# Patient Record
Sex: Male | Born: 1990
Health system: Southern US, Community
[De-identification: ages and names within clinical notes are randomized; demographics above are authoritative.]

## PROBLEM LIST (undated history)

## (undated) ENCOUNTER — Emergency Department (HOSPITAL_BASED_OUTPATIENT_CLINIC_OR_DEPARTMENT_OTHER): Payer: BC Managed Care – PPO

## (undated) DIAGNOSIS — I10 Essential (primary) hypertension: Secondary | ICD-10-CM

## (undated) DIAGNOSIS — E119 Type 2 diabetes mellitus without complications: Secondary | ICD-10-CM

---

## 2018-11-27 ENCOUNTER — Encounter (HOSPITAL_BASED_OUTPATIENT_CLINIC_OR_DEPARTMENT_OTHER): Payer: Self-pay

## 2018-11-27 ENCOUNTER — Emergency Department (HOSPITAL_BASED_OUTPATIENT_CLINIC_OR_DEPARTMENT_OTHER)
Admission: EM | Admit: 2018-11-27 | Discharge: 2018-11-27 | Disposition: A | Discharge status: Home / Self Care | Payer: Self-pay | Attending: Emergency Medicine | Admitting: Emergency Medicine

## 2018-11-27 ENCOUNTER — Other Ambulatory Visit: Payer: Self-pay

## 2018-11-27 DIAGNOSIS — Z794 Long term (current) use of insulin: Secondary | ICD-10-CM | POA: Insufficient documentation

## 2018-11-27 DIAGNOSIS — E1165 Type 2 diabetes mellitus with hyperglycemia: Secondary | ICD-10-CM | POA: Insufficient documentation

## 2018-11-27 DIAGNOSIS — H579 Unspecified disorder of eye and adnexa: Secondary | ICD-10-CM

## 2018-11-27 DIAGNOSIS — R739 Hyperglycemia, unspecified: Secondary | ICD-10-CM

## 2018-11-27 DIAGNOSIS — H5789 Other specified disorders of eye and adnexa: Secondary | ICD-10-CM | POA: Insufficient documentation

## 2018-11-27 HISTORY — DX: Type 2 diabetes mellitus without complications: E11.9

## 2018-11-27 LAB — CBC
HCT: 41.2 % (ref 39.0–52.0)
Hemoglobin: 15 g/dL (ref 13.0–17.0)
MCH: 30.2 pg (ref 26.0–34.0)
MCHC: 36.4 g/dL — ABNORMAL HIGH (ref 30.0–36.0)
MCV: 83.1 fL (ref 80.0–100.0)
Platelets: 238 10*3/uL (ref 150–400)
RBC: 4.96 MIL/uL (ref 4.22–5.81)
RDW: 12.9 % (ref 11.5–15.5)
WBC: 8.4 10*3/uL (ref 4.0–10.5)
nRBC: 0 % (ref 0.0–0.2)

## 2018-11-27 LAB — URINALYSIS, MICROSCOPIC (REFLEX)
Bacteria, UA: NONE SEEN
RBC / HPF: NONE SEEN RBC/hpf (ref 0–5)
Squamous Epithelial / HPF: NONE SEEN (ref 0–5)
WBC, UA: NONE SEEN WBC/hpf (ref 0–5)

## 2018-11-27 LAB — URINALYSIS, ROUTINE W REFLEX MICROSCOPIC
Bilirubin Urine: NEGATIVE
Glucose, UA: >=500 mg/dL — AB
Hgb urine dipstick: NEGATIVE
Ketones, ur: NEGATIVE mg/dL
Leukocytes,Ua: NEGATIVE
Nitrite: NEGATIVE
Protein, ur: NEGATIVE mg/dL
Specific Gravity, Urine: 1.01 (ref 1.005–1.030)
pH: 6.5 (ref 5.0–8.0)

## 2018-11-27 LAB — BASIC METABOLIC PANEL
Anion gap: 15 (ref 5–15)
BUN: 10 mg/dL (ref 6–20)
CO2: 21 mmol/L — ABNORMAL LOW (ref 22–32)
Calcium: 9.1 mg/dL (ref 8.9–10.3)
Chloride: 95 mmol/L — ABNORMAL LOW (ref 98–111)
Creatinine, Ser: 0.63 mg/dL (ref 0.61–1.24)
GFR calc Af Amer: >60 mL/min (ref >60)
GFR calc non Af Amer: >60 mL/min (ref >60)
Glucose, Bld: 472 mg/dL — ABNORMAL HIGH (ref 70–99)
Potassium: 3.8 mmol/L (ref 3.5–5.1)
Sodium: 131 mmol/L — ABNORMAL LOW (ref 135–145)

## 2018-11-27 LAB — CBG MONITORING, ED
Glucose-Capillary: 301 mg/dL — ABNORMAL HIGH (ref 70–99)
Glucose-Capillary: 431 mg/dL — ABNORMAL HIGH (ref 70–99)
Glucose-Capillary: 434 mg/dL — ABNORMAL HIGH (ref 70–99)

## 2018-11-27 MED ORDER — TETRACAINE HCL 0.5 % OP SOLN
2.0000 [drp] | Freq: Once | OPHTHALMIC | Status: AC
  Administered 2018-11-27: 2 [drp] via OPHTHALMIC
  Filled 2018-11-27: qty 4

## 2018-11-27 MED ORDER — INSULIN REGULAR HUMAN 100 UNIT/ML IJ SOLN
8.0000 [IU] | Freq: Once | INTRAMUSCULAR | Status: AC
  Administered 2018-11-27: 8 [IU] via SUBCUTANEOUS
  Filled 2018-11-27: qty 1

## 2018-11-27 MED ORDER — SODIUM CHLORIDE 0.9 % IV BOLUS
1000.0000 mL | Freq: Once | INTRAVENOUS | Status: AC
  Administered 2018-11-27: 01:00:00 1000 mL via INTRAVENOUS

## 2018-11-27 MED ORDER — INSULIN ASPART 100 UNIT/ML IV SOLN
8.0000 [IU] | Freq: Once | INTRAVENOUS | Status: AC
  Administered 2018-11-27: 8 [IU] via INTRAVENOUS
  Filled 2018-11-27: qty 1

## 2018-11-27 MED ORDER — FLUORESCEIN SODIUM 1 MG OP STRP
1.0000 | ORAL_STRIP | Freq: Once | OPHTHALMIC | Status: AC
  Administered 2018-11-27: 01:00:00 1 via OPHTHALMIC
  Filled 2018-11-27: qty 1

## 2018-11-27 NOTE — ED Provider Notes (Signed)
[facility] EMERGENCY DEPARTMENT Provider Note   CSN: [id number] Arrival date & time: 11/27/18  0008     History   Chief Complaint Chief Complaint  Patient presents with  . Blood Sugar Problem    HPI Stanley James is a 28 y.o. male.     Patient is a 28 year old male with past medical history of diabetes.  He presents today for evaluation of elevated blood sugar.  He states that his sugars been running high for the past several days and he is having difficulty getting it to come down.  He denies any changes in his diet and denies any recent illness.  He denies any fevers or chills.  He does report generalized weakness.  He reports working on his car today and feels as though something may have scratched his eye.  He describes burning and irritation in the right eye along with blurry vision.  The history is provided by the patient.    Past Medical History:  Diagnosis Date  . Diabetes mellitus without complication ([place])     There are no active problems to display for this patient.   History reviewed. No pertinent surgical history.      Home Medications    Prior to Admission medications   Medication Sig Start Date End Date Taking? Authorizing Provider  Insulin Degludec (TRESIBA) 100 UNIT/ML SOLN Inject 50 Units into the skin.   Yes [provider]  insulin lispro (HUMALOG) 100 UNIT/ML injection Inject 12 Units into the skin 3 (three) times daily before meals.   Yes [provider]    Family History No family history on file.  Social History Social History   Tobacco Use  . Smoking status: Never Smoker  . Smokeless tobacco: Never Used  Substance Use Topics  . Alcohol use: Never    Frequency: Never  . Drug use: Never     Allergies   Patient has no known allergies.   Review of Systems Review of Systems  All other systems reviewed and are negative.    Physical Exam Updated Vital Signs BP (!) 148/82 (BP Location: Right  Arm)   Pulse 79   Temp 98.3 F (36.8 C) (Oral)   Resp 20   Ht 5\' 8"  (1.727 m)   Wt 88.9 kg   SpO2 99%   BMI 29.80 kg/m   Physical Exam Vitals signs and nursing note reviewed.  Constitutional:      General: He is not in acute distress.    Appearance: He is well-developed. He is not diaphoretic.  HENT:     Head: Normocephalic and atraumatic.  Eyes:     Extraocular Movements: Extraocular movements intact.     Pupils: Pupils are equal, round, and reactive to light.     Comments: Patient's right eye has a clear cornea with no obvious foreign body.  The anterior chamber is clear and pupil is reactive.  Conjunctiva appears normal with no foreign body noted under the lids.  Fluorescein staining reveals no uptake.  Neck:     Musculoskeletal: Normal range of motion and neck supple.  Cardiovascular:     Rate and Rhythm: Normal rate and regular rhythm.     Heart sounds: No murmur. No friction rub.  Pulmonary:     Effort: Pulmonary effort is normal. No respiratory distress.     Breath sounds: Normal breath sounds. No wheezing or rales.  Abdominal:     General: Bowel sounds are normal. There is no distension.  Palpations: Abdomen is soft.     Tenderness: There is no abdominal tenderness.  Musculoskeletal: Normal range of motion.  Skin:    General: Skin is warm and dry.  Neurological:     Mental Status: He is alert and oriented to person, place, and time.     Coordination: Coordination normal.      ED Treatments / Results  Labs (all labs ordered are listed, but only abnormal results are displayed) Labs Reviewed  CBG MONITORING, ED - Abnormal; Notable for the following components:      Result Value   Glucose-Capillary 434 (*)    All other components within normal limits  BASIC METABOLIC PANEL  CBC  URINALYSIS, ROUTINE W REFLEX MICROSCOPIC    EKG None  Radiology No results found.  Procedures Procedures (including critical care time)  Medications Ordered in ED  Medications  insulin regular (NOVOLIN R) 100 units/mL injection 8 Units (has no administration in time range)  sodium chloride 0.9 % bolus 1,000 mL (has no administration in time range)  fluorescein ophthalmic strip 1 strip (has no administration in time range)  tetracaine (PONTOCAINE) 0.5 % ophthalmic solution 2 drop (has no administration in time range)     Initial Impression / Assessment and Plan / ED Course  I have reviewed the triage vital signs and the nursing notes.  Pertinent labs & imaging results that were available during my care of the patient were reviewed by me and considered in my medical decision making (see chart for details).  Patient presenting here with elevated blood sugar.  He is hyperglycemic, however there is no evidence for DKA.  Patient given IV fluids and doses of insulin and his sugar is now improving.  I see no indication for admission.  Patient will be discharged with instructions to keep a record of his blood sugars and follow-up with his primary doctor for possible adjustments in his insulin regimen.  He also has a foreign body sensation to his eye, the etiology of which I cannot ascertain.  I see no evidence for corneal abrasion or corneal foreign body.  There is also no evidence for foreign body under the eyelids when his eyes were everted.    Final Clinical Impressions(s) / ED Diagnoses   Final diagnoses:  None    ED Discharge Orders    None       Geoffery Lyonselo, Lucita Montoya, MD 11/27/18 [phone]

## 2018-11-27 NOTE — ED Triage Notes (Signed)
Pt c/o hyperglycemia today and reports a BGL 420. Pt also c/o blurred vision in his R eye.

## 2018-11-27 NOTE — Discharge Instructions (Signed)
Continue medications as previously prescribed.  Keep a record of your blood sugars and take this with you to your next doctor's appointment to discuss whether or not you need adjustments made in your insulin regimen.  Ideally this appointment should take place in the next week.  Return to the emergency department if your symptoms significantly worsen or change.

## 2018-11-28 MED FILL — Insulin Regular (Human) Inj 100 Unit/ML: INTRAMUSCULAR | Qty: 0.08 | Status: AC

## 2019-01-07 ENCOUNTER — Telehealth (HOSPITAL_BASED_OUTPATIENT_CLINIC_OR_DEPARTMENT_OTHER): Payer: Self-pay | Admitting: *Deleted

## 2019-01-07 ENCOUNTER — Emergency Department (HOSPITAL_BASED_OUTPATIENT_CLINIC_OR_DEPARTMENT_OTHER)
Admission: EM | Admit: 2019-01-07 | Discharge: 2019-01-07 | Disposition: A | Discharge status: Home / Self Care | Payer: Self-pay | Attending: Emergency Medicine | Admitting: Emergency Medicine

## 2019-01-07 ENCOUNTER — Encounter (HOSPITAL_BASED_OUTPATIENT_CLINIC_OR_DEPARTMENT_OTHER): Payer: Self-pay | Admitting: Emergency Medicine

## 2019-01-07 ENCOUNTER — Other Ambulatory Visit: Payer: Self-pay

## 2019-01-07 DIAGNOSIS — Z794 Long term (current) use of insulin: Secondary | ICD-10-CM | POA: Insufficient documentation

## 2019-01-07 DIAGNOSIS — R110 Nausea: Secondary | ICD-10-CM | POA: Insufficient documentation

## 2019-01-07 DIAGNOSIS — E1165 Type 2 diabetes mellitus with hyperglycemia: Secondary | ICD-10-CM | POA: Insufficient documentation

## 2019-01-07 DIAGNOSIS — R739 Hyperglycemia, unspecified: Secondary | ICD-10-CM

## 2019-01-07 LAB — CBC WITH DIFFERENTIAL/PLATELET
Abs Immature Granulocytes: 0.02 10*3/uL (ref 0.00–0.07)
Basophils Absolute: 0.1 10*3/uL (ref 0.0–0.1)
Basophils Relative: 1 %
Eosinophils Absolute: 0.1 10*3/uL (ref 0.0–0.5)
Eosinophils Relative: 2 %
HCT: 45.5 % (ref 39.0–52.0)
Hemoglobin: 15.7 g/dL (ref 13.0–17.0)
Immature Granulocytes: 0 %
Lymphocytes Relative: 38 %
Lymphs Abs: 2.5 10*3/uL (ref 0.7–4.0)
MCH: 29.5 pg (ref 26.0–34.0)
MCHC: 34.5 g/dL (ref 30.0–36.0)
MCV: 85.4 fL (ref 80.0–100.0)
Monocytes Absolute: 0.4 10*3/uL (ref 0.1–1.0)
Monocytes Relative: 7 %
Neutro Abs: 3.4 10*3/uL (ref 1.7–7.7)
Neutrophils Relative %: 52 %
Platelets: 174 10*3/uL (ref 150–400)
RBC: 5.33 MIL/uL (ref 4.22–5.81)
RDW: 12.6 % (ref 11.5–15.5)
WBC: 6.5 10*3/uL (ref 4.0–10.5)
nRBC: 0 % (ref 0.0–0.2)

## 2019-01-07 LAB — POCT I-STAT EG7
Acid-Base Excess: 1 mmol/L (ref 0.0–2.0)
Bicarbonate: 26.4 mmol/L (ref 20.0–28.0)
Calcium, Ion: 1.26 mmol/L (ref 1.15–1.40)
HCT: 46 % (ref 39.0–52.0)
Hemoglobin: 15.6 g/dL (ref 13.0–17.0)
O2 Saturation: 67 %
Patient temperature: 99
Potassium: 4.5 mmol/L (ref 3.5–5.1)
Sodium: 134 mmol/L — ABNORMAL LOW (ref 135–145)
TCO2: 28 mmol/L (ref 22–32)
pCO2, Ven: 44.5 mmHg (ref 44.0–60.0)
pH, Ven: 7.383 (ref 7.250–7.430)
pO2, Ven: 36 mmHg (ref 32.0–45.0)

## 2019-01-07 LAB — URINALYSIS, ROUTINE W REFLEX MICROSCOPIC
Bilirubin Urine: NEGATIVE
Glucose, UA: >=500 mg/dL — AB
Hgb urine dipstick: NEGATIVE
Ketones, ur: 15 mg/dL — AB
Leukocytes,Ua: NEGATIVE
Nitrite: NEGATIVE
Protein, ur: NEGATIVE mg/dL
Specific Gravity, Urine: 1.01 (ref 1.005–1.030)
pH: 7 (ref 5.0–8.0)

## 2019-01-07 LAB — CBG MONITORING, ED
Glucose-Capillary: 424 mg/dL — ABNORMAL HIGH (ref 70–99)
Glucose-Capillary: 299 mg/dL — ABNORMAL HIGH (ref 70–99)
Glucose-Capillary: 235 mg/dL — ABNORMAL HIGH (ref 70–99)

## 2019-01-07 LAB — URINALYSIS, MICROSCOPIC (REFLEX)
RBC / HPF: NONE SEEN RBC/hpf (ref 0–5)
Squamous Epithelial / HPF: NONE SEEN (ref 0–5)
WBC, UA: NONE SEEN WBC/hpf (ref 0–5)

## 2019-01-07 LAB — BASIC METABOLIC PANEL
Anion gap: 11 (ref 5–15)
BUN: 14 mg/dL (ref 6–20)
CO2: 24 mmol/L (ref 22–32)
Calcium: 9.8 mg/dL (ref 8.9–10.3)
Chloride: 97 mmol/L — ABNORMAL LOW (ref 98–111)
Creatinine, Ser: 0.69 mg/dL (ref 0.61–1.24)
GFR calc Af Amer: >60 mL/min (ref >60)
GFR calc non Af Amer: >60 mL/min (ref >60)
Glucose, Bld: 382 mg/dL — ABNORMAL HIGH (ref 70–99)
Potassium: 4.3 mmol/L (ref 3.5–5.1)
Sodium: 132 mmol/L — ABNORMAL LOW (ref 135–145)

## 2019-01-07 MED ORDER — INSULIN ASPART 100 UNIT/ML IV SOLN
10.0000 [IU] | Freq: Once | INTRAVENOUS | Status: AC
  Administered 2019-01-07: 10 [IU] via INTRAVENOUS
  Filled 2019-01-07: qty 1

## 2019-01-07 MED ORDER — ONDANSETRON HCL 4 MG/2ML IJ SOLN
4.0000 mg | Freq: Once | INTRAMUSCULAR | Status: DC
  Filled 2019-01-07: qty 2

## 2019-01-07 MED ORDER — SODIUM CHLORIDE 0.9 % IV BOLUS
1000.0000 mL | Freq: Once | INTRAVENOUS | Status: AC
  Administered 2019-01-07: 12:00:00 1000 mL via INTRAVENOUS

## 2019-01-07 MED ORDER — SODIUM CHLORIDE 0.9 % IV SOLN
INTRAVENOUS | Status: DC
  Administered 2019-01-07: 13:00:00 via INTRAVENOUS

## 2019-01-07 MED ORDER — ONDANSETRON 4 MG PO TBDP: 4.0000 mg | ORAL_TABLET | Freq: Three times a day (TID) | ORAL | 0 refills | Status: DC | PRN

## 2019-01-07 NOTE — Telephone Encounter (Signed)
Pt called stating he is out of his nausea medication.Spoke with Dr. Laverta Baltimore who reviewed chart and states will send in Rx to [facility] [address] (24 hr pharmacy) in [facility]. Pt verbalized understanding

## 2019-01-07 NOTE — ED Triage Notes (Signed)
Pt reports hyperglycemia x 3 days. States he is compliant with medication.

## 2019-01-07 NOTE — ED Provider Notes (Signed)
[facility] EMERGENCY DEPARTMENT Provider Note   CSN: [id number] Arrival date & time: 01/07/19  1028   History   Chief Complaint Chief Complaint  Patient presents with  . Hyperglycemia   HPI Stanley James is a 28 y.o. male with past medical history significant for diabetes who presents for evaluation of elevated blood sugar.  Patient states he had 2 episodes of nonbloody, nonbilious emesis.  Patient states his blood sugars have been in the 300s/400s over the last 2 days.  Prior to this morning he is tolerating p.o. intake without difficulty.  He has not tried any oral intake since he had his episodes of emesis.  Denies fever, chills, chest pain, shortness breath, abdominal pain, diarrhea, dysuria.  No recent sick contacts.  The last took his dose of home insulin yesterday around 5 PM.  Denies prior history of DKA or admission to hospital for hyperglycemia.  Denies additional aggravating or relieving factors.  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  No interpreter is used.     HPI  Past Medical History:  Diagnosis Date  . Diabetes mellitus without complication ([facility])     There are no active problems to display for this patient.   History reviewed. No pertinent surgical history.      Home Medications    Prior to Admission medications   Medication Sig Start Date End Date Taking? Authorizing Provider  Insulin Degludec (TRESIBA) 100 UNIT/ML SOLN Inject 50 Units into the skin.    [provider]  insulin lispro (HUMALOG) 100 UNIT/ML injection Inject 12 Units into the skin 3 (three) times daily before meals.    [provider]    Family History No family history on file.  Social History Social History   Tobacco Use  . Smoking status: Never Smoker  . Smokeless tobacco: Never Used  Substance Use Topics  . Alcohol use: Never    Frequency: Never  . Drug use: Never     Allergies   Patient has no known  allergies.   Review of Systems Review of Systems  Constitutional: Negative.   HENT: Negative.   Respiratory: Negative.   Cardiovascular: Negative.   Gastrointestinal: Positive for nausea and vomiting. Negative for abdominal distention, abdominal pain, anal bleeding, blood in stool, constipation, diarrhea and rectal pain.  Genitourinary: Negative.   Musculoskeletal: Negative.   Skin: Negative.   Neurological: Negative.   All other systems reviewed and are negative.    Physical Exam Updated Vital Signs BP 140/67 (BP Location: Right Arm)   Pulse 64   Temp 97.9 F (36.6 C) (Oral)   Resp 18   Ht 5\' 8"  (1.727 m)   Wt 80.7 kg   SpO2 100%   BMI 27.06 kg/m   Physical Exam Vitals signs and nursing note reviewed.  Constitutional:      General: He is not in acute distress.    Appearance: He is well-developed. He is not ill-appearing, toxic-appearing or diaphoretic.  HENT:     Head: Normocephalic and atraumatic.     Nose: Nose normal.     Mouth/Throat:     Mouth: Mucous membranes are moist.     Pharynx: Oropharynx is clear.  Eyes:     Pupils: Pupils are equal, round, and reactive to light.  Neck:     Musculoskeletal: Normal range of motion and neck supple.  Cardiovascular:     Rate and Rhythm: Normal rate and regular rhythm.     Pulses:  Normal pulses.     Heart sounds: Normal heart sounds.  Pulmonary:     Effort: Pulmonary effort is normal. No respiratory distress.     Breath sounds: Normal breath sounds.  Abdominal:     General: Bowel sounds are normal. There is no distension.     Palpations: Abdomen is soft.     Tenderness: There is no abdominal tenderness. There is no right CVA tenderness, left CVA tenderness, guarding or rebound.  Musculoskeletal: Normal range of motion.        General: No swelling, tenderness, deformity or signs of injury.     Right lower leg: No edema.     Left lower leg: No edema.  Skin:    General: Skin is warm and dry.     Capillary Refill:  Capillary refill takes less than 2 seconds.  Neurological:     General: No focal deficit present.     Mental Status: He is alert and oriented to person, place, and time.     Motor: No weakness.     Gait: Gait normal.     ED Treatments / Results  Labs (all labs ordered are listed, but only abnormal results are displayed) Labs Reviewed  URINALYSIS, ROUTINE W REFLEX MICROSCOPIC - Abnormal; Notable for the following components:      Result Value   Glucose, UA >=500 (*)    Ketones, ur 15 (*)    All other components within normal limits  URINALYSIS, MICROSCOPIC (REFLEX) - Abnormal; Notable for the following components:   Bacteria, UA FEW (*)    All other components within normal limits  BASIC METABOLIC PANEL - Abnormal; Notable for the following components:   Sodium 132 (*)    Chloride 97 (*)    Glucose, Bld 382 (*)    All other components within normal limits  CBG MONITORING, ED - Abnormal; Notable for the following components:   Glucose-Capillary 424 (*)    All other components within normal limits  CBG MONITORING, ED - Abnormal; Notable for the following components:   Glucose-Capillary 299 (*)    All other components within normal limits  POCT I-STAT EG7 - Abnormal; Notable for the following components:   Sodium 134 (*)    All other components within normal limits  CBG MONITORING, ED - Abnormal; Notable for the following components:   Glucose-Capillary 235 (*)    All other components within normal limits  CBC WITH DIFFERENTIAL/PLATELET  BLOOD GAS, VENOUS    EKG None  Radiology No results found.  Procedures Procedures (including critical care time)  Medications Ordered in ED Medications  sodium chloride 0.9 % bolus 1,000 mL (0 mLs Intravenous Stopped 01/07/19 1305)    And  0.9 %  sodium chloride infusion ( Intravenous New Bag/Given 01/07/19 1305)  ondansetron (ZOFRAN) injection 4 mg (4 mg Intravenous Refused 01/07/19 1156)  insulin aspart (novoLOG) injection 10  Units (10 Units Intravenous Given 01/07/19 1226)   Initial Impression / Assessment and Plan / ED Course  I have reviewed the triage vital signs and the nursing notes.  Pertinent labs & imaging results that were available during my care of the patient were reviewed by me and considered in my medical decision making (see chart for details).  28 year old male appears otherwise well presents for evaluation of elevated blood sugar and emesis.  He is afebrile, nonseptic, non-ill-appearing.  2 episodes of nonbloody, nonbilious emesis.  Heart and lungs clear.  Abdomen soft, nontender without rebound or guarding.  CBG 424.  Urinalysis with glucose with 15 ketones, VBG with stable pH and normal bicarb.  Will start on IV fluids, Zofran for nausea, 10 units insulin.  Labs personally reviewed and interpreted: CBC without leukocytosis, hemoglobin stable at 10.1 Metabolic panel with hyponatremia 132, glucose elevated at 382 no anion gap. No DKA.  Getting IV fluids, 10 units insulin.  We will need to recheck CBG.  If trending down may DC home with close outpatient follow-up.    Patient tolerating PO intake without difficulty. CBG trending down.  Patient is nontoxic, nonseptic appearing, in no apparent distress.  Patient's pain and other symptoms adequately managed in emergency department. Patient does not meet the SIRS or Sepsis criteria.  On repeat exam patient does not have a surgical abdomin and there are no peritoneal signs.  No indication of appendicitis, bowel obstruction, bowel perforation, cholecystitis, diverticulitis.    The patient has been appropriately medically screened and/or stabilized in the ED. I have low suspicion for any other emergent medical condition which would require further screening, evaluation or treatment in the ED or require inpatient management.      Final Clinical Impressions(s) / ED Diagnoses   Final diagnoses:  Hyperglycemia  Nausea    ED Discharge Orders    None        Viraaj Vorndran A, PA-C 01/07/19 1433    Little, Wenda Overland, MD 01/08/19 [phone]

## 2019-01-07 NOTE — ED Notes (Signed)
Pt tolerating water. Given diet gingerale per pt request

## 2019-01-07 NOTE — Discharge Instructions (Signed)
Take your medication as prescribed. Follow up with your PCP for reevaluation of your insulin dosing,  Return for new or worsening symptoms.

## 2019-01-07 NOTE — Telephone Encounter (Signed)
Patient was seen in the emergency department earlier today.  Initially thought they had Zofran at home but then called back to say they did not.  I will call in a small prescription of Zofran to the pharmacy of choice.

## 2019-01-07 NOTE — ED Notes (Signed)
Pt given work note. Ambulated to d/c window with steady gait

## 2019-04-25 ENCOUNTER — Emergency Department (HOSPITAL_BASED_OUTPATIENT_CLINIC_OR_DEPARTMENT_OTHER): Payer: Self-pay

## 2019-04-25 ENCOUNTER — Other Ambulatory Visit: Payer: Self-pay

## 2019-04-25 ENCOUNTER — Emergency Department (HOSPITAL_BASED_OUTPATIENT_CLINIC_OR_DEPARTMENT_OTHER)
Admission: EM | Admit: 2019-04-25 | Discharge: 2019-04-25 | Disposition: A | Discharge status: Home / Self Care | Payer: Self-pay | Attending: Emergency Medicine | Admitting: Emergency Medicine

## 2019-04-25 ENCOUNTER — Encounter (HOSPITAL_BASED_OUTPATIENT_CLINIC_OR_DEPARTMENT_OTHER): Payer: Self-pay

## 2019-04-25 DIAGNOSIS — R739 Hyperglycemia, unspecified: Secondary | ICD-10-CM

## 2019-04-25 DIAGNOSIS — Z794 Long term (current) use of insulin: Secondary | ICD-10-CM | POA: Insufficient documentation

## 2019-04-25 DIAGNOSIS — R1084 Generalized abdominal pain: Secondary | ICD-10-CM | POA: Insufficient documentation

## 2019-04-25 DIAGNOSIS — I10 Essential (primary) hypertension: Secondary | ICD-10-CM | POA: Insufficient documentation

## 2019-04-25 HISTORY — DX: Essential (primary) hypertension: I10

## 2019-04-25 LAB — COMPREHENSIVE METABOLIC PANEL
ALT: 31 U/L (ref 0–44)
AST: 24 U/L (ref 15–41)
Albumin: 4.5 g/dL (ref 3.5–5.0)
Alkaline Phosphatase: 102 U/L (ref 38–126)
Anion gap: 11 (ref 5–15)
BUN: 11 mg/dL (ref 6–20)
CO2: 26 mmol/L (ref 22–32)
Calcium: 9.5 mg/dL (ref 8.9–10.3)
Chloride: 94 mmol/L — ABNORMAL LOW (ref 98–111)
Creatinine, Ser: 0.75 mg/dL (ref 0.61–1.24)
GFR calc Af Amer: >60 mL/min (ref >60)
GFR calc non Af Amer: >60 mL/min (ref >60)
Glucose, Bld: 515 mg/dL (ref 70–99)
Potassium: 5.1 mmol/L (ref 3.5–5.1)
Sodium: 131 mmol/L — ABNORMAL LOW (ref 135–145)
Total Bilirubin: 0.8 mg/dL (ref 0.3–1.2)
Total Protein: 8 g/dL (ref 6.5–8.1)

## 2019-04-25 LAB — URINALYSIS, ROUTINE W REFLEX MICROSCOPIC
Bilirubin Urine: NEGATIVE
Glucose, UA: >=500 mg/dL — AB
Hgb urine dipstick: NEGATIVE
Ketones, ur: NEGATIVE mg/dL
Leukocytes,Ua: NEGATIVE
Nitrite: NEGATIVE
Protein, ur: NEGATIVE mg/dL
Specific Gravity, Urine: 1.015 (ref 1.005–1.030)
pH: 5.5 (ref 5.0–8.0)

## 2019-04-25 LAB — CBC WITH DIFFERENTIAL/PLATELET
Abs Immature Granulocytes: 0.01 10*3/uL (ref 0.00–0.07)
Basophils Absolute: 0.1 10*3/uL (ref 0.0–0.1)
Basophils Relative: 1 %
Eosinophils Absolute: 0.1 10*3/uL (ref 0.0–0.5)
Eosinophils Relative: 1 %
HCT: 44.1 % (ref 39.0–52.0)
Hemoglobin: 15.3 g/dL (ref 13.0–17.0)
Immature Granulocytes: 0 %
Lymphocytes Relative: 46 %
Lymphs Abs: 2.8 10*3/uL (ref 0.7–4.0)
MCH: 28.6 pg (ref 26.0–34.0)
MCHC: 34.7 g/dL (ref 30.0–36.0)
MCV: 82.4 fL (ref 80.0–100.0)
Monocytes Absolute: 0.4 10*3/uL (ref 0.1–1.0)
Monocytes Relative: 7 %
Neutro Abs: 2.8 10*3/uL (ref 1.7–7.7)
Neutrophils Relative %: 45 %
Platelets: 192 10*3/uL (ref 150–400)
RBC: 5.35 MIL/uL (ref 4.22–5.81)
RDW: 13.2 % (ref 11.5–15.5)
WBC: 6.1 10*3/uL (ref 4.0–10.5)
nRBC: 0 % (ref 0.0–0.2)

## 2019-04-25 LAB — POCT I-STAT EG7
Bicarbonate: 26.1 mmol/L (ref 20.0–28.0)
Calcium, Ion: 1.2 mmol/L (ref 1.15–1.40)
HCT: 46 % (ref 39.0–52.0)
Hemoglobin: 15.6 g/dL (ref 13.0–17.0)
O2 Saturation: 48 %
Potassium: 5 mmol/L (ref 3.5–5.1)
Sodium: 131 mmol/L — ABNORMAL LOW (ref 135–145)
TCO2: 27 mmol/L (ref 22–32)
pCO2, Ven: 44.8 mmHg (ref 44.0–60.0)
pH, Ven: 7.374 (ref 7.250–7.430)
pO2, Ven: 27 mmHg — CL (ref 32.0–45.0)

## 2019-04-25 LAB — CBG MONITORING, ED
Glucose-Capillary: 545 mg/dL (ref 70–99)
Glucose-Capillary: 243 mg/dL — ABNORMAL HIGH (ref 70–99)

## 2019-04-25 LAB — URINALYSIS, MICROSCOPIC (REFLEX): WBC, UA: NONE SEEN WBC/hpf (ref 0–5)

## 2019-04-25 MED ORDER — DICYCLOMINE HCL 20 MG PO TABS: 20.0000 mg | ORAL_TABLET | Freq: Three times a day (TID) | ORAL | 0 refills | Status: DC | PRN

## 2019-04-25 MED ORDER — ONDANSETRON 4 MG PO TBDP: 4.0000 mg | ORAL_TABLET | Freq: Three times a day (TID) | ORAL | 0 refills | Status: DC | PRN

## 2019-04-25 MED ORDER — INSULIN REGULAR HUMAN 100 UNIT/ML IJ SOLN
10.0000 [IU] | Freq: Once | INTRAMUSCULAR | Status: AC
  Administered 2019-04-25: 10 [IU] via INTRAVENOUS
  Filled 2019-04-25: qty 1

## 2019-04-25 MED ORDER — SODIUM CHLORIDE 0.9 % IV BOLUS
1000.0000 mL | Freq: Once | INTRAVENOUS | Status: AC
  Administered 2019-04-25: 17:00:00 1000 mL via INTRAVENOUS

## 2019-04-25 MED ORDER — IOHEXOL 300 MG/ML  SOLN
100.0000 mL | Freq: Once | INTRAMUSCULAR | Status: AC
  Administered 2019-04-25: 100 mL via INTRAVENOUS

## 2019-04-25 NOTE — ED Provider Notes (Signed)
Emergency Department Provider Note   I have reviewed the triage vital signs and the nursing notes.   HISTORY  Chief Complaint Hyperglycemia   HPI Stanley James is a 29 y.o. male with past medical history of hypertension and insulin-dependent diabetes presents to the emergency department with elevated blood sugars at home with left-sided abdominal pain.  His abdominal pain is cramping in nature and intermittent.  He states that severe at times with no clear modifying factors.  No radiation of symptoms.  No fevers or chills.  He has been compliant with his insulin.  He denies any vomiting or diarrhea.  He did have COVID-19 in December but has made a full recovery with minimal symptoms.   Past Medical History:  Diagnosis Date  . Diabetes mellitus without complication ([place])   . Hypertension     There are no problems to display for this patient.   History reviewed. No pertinent surgical history.  Allergies Patient has no known allergies.  History reviewed. No pertinent family history.  Social History Social History   Tobacco Use  . Smoking status: Former Research scientist (life sciences)  . Smokeless tobacco: Never Used  Substance Use Topics  . Alcohol use: Never  . Drug use: Never    Review of Systems  Constitutional: No fever/chills. High blood sugar.  Eyes: No visual changes. ENT: No sore throat. Cardiovascular: Denies chest pain. Respiratory: Denies shortness of breath. Gastrointestinal: Positive cramping left sided abdominal pain.  No nausea, no vomiting.  No diarrhea.  No constipation. Genitourinary: Negative for dysuria. Musculoskeletal: Negative for back pain. Skin: Negative for rash. Neurological: Negative for headaches, focal weakness or numbness.  10-point ROS otherwise negative.  ____________________________________________   PHYSICAL EXAM:  VITAL SIGNS: ED Triage Vitals  Enc Vitals Group     BP 04/25/19 1649 (!) 151/79     Pulse Rate 04/25/19 1649 90     Resp  04/25/19 1649 17     Temp 04/25/19 1649 98.7 F (37.1 C)     Temp Source 04/25/19 1649 Oral     SpO2 04/25/19 1649 99 %     Weight 04/25/19 1650 174 lb (78.9 kg)     Height 04/25/19 1650 5\' 8"  (1.727 m)   Constitutional: Alert and oriented. Well appearing and in no acute distress. Eyes: Conjunctivae are normal.  Head: Atraumatic. Nose: No congestion/rhinnorhea. Mouth/Throat: Mucous membranes are moist.  Neck: No stridor.  Cardiovascular: Normal rate, regular rhythm. Good peripheral circulation. Grossly normal heart sounds.   Respiratory: Normal respiratory effort.  No retractions. Lungs CTAB. Gastrointestinal: Soft with mild left-sided tenderness.  No rebound or guarding. No distention.  Musculoskeletal: No gross deformities of extremities. Neurologic:  Normal speech and language.  Skin:  Skin is warm, dry and intact. No rash noted.  ____________________________________________   LABS (all labs ordered are listed, but only abnormal results are displayed)  Labs Reviewed  COMPREHENSIVE METABOLIC PANEL - Abnormal; Notable for the following components:      Result Value   Sodium 131 (*)    Chloride 94 (*)    Glucose, Bld 515 (*)    All other components within normal limits  URINALYSIS, ROUTINE W REFLEX MICROSCOPIC - Abnormal; Notable for the following components:   Glucose, UA >=500 (*)    All other components within normal limits  URINALYSIS, MICROSCOPIC (REFLEX) - Abnormal; Notable for the following components:   Bacteria, UA RARE (*)    All other components within normal limits  CBG MONITORING, ED - Abnormal; Notable for  the following components:   Glucose-Capillary 545 (*)    All other components within normal limits  POCT I-STAT EG7 - Abnormal; Notable for the following components:   pO2, Ven 27.0 (*)    Sodium 131 (*)    All other components within normal limits  CBG MONITORING, ED - Abnormal; Notable for the following components:   Glucose-Capillary 243 (*)    All  other components within normal limits  CBC WITH DIFFERENTIAL/PLATELET  I-STAT VENOUS BLOOD GAS, ED  CBG MONITORING, ED   ____________________________________________  EKG  None  ____________________________________________  RADIOLOGY  CT ABDOMEN PELVIS W CONTRAST  Result Date: 04/25/2019 CLINICAL DATA:  29 year old male with left lower quadrant abdominal pain. Concern for acute diverticulitis. EXAM: CT ABDOMEN AND PELVIS WITH CONTRAST TECHNIQUE: Multidetector CT imaging of the abdomen and pelvis was performed using the standard protocol following bolus administration of intravenous contrast. CONTRAST:  OMNIPAQUE IOHEXOL 300 MG/ML  SOLN COMPARISON:  CT abdomen pelvis report dated 05/31/2011. The images are not available for direct comparison. FINDINGS: Lower chest: The visualized lung bases are clear. No intra-abdominal free air or free fluid. Hepatobiliary: Diffuse fatty infiltration of the liver. No intrahepatic biliary ductal dilatation. The gallbladder is unremarkable. Pancreas: Unremarkable. No pancreatic ductal dilatation or surrounding inflammatory changes. Spleen: Normal in size without focal abnormality. Adrenals/Urinary Tract: The adrenal glands are unremarkable. The kidneys, visualized ureters, and urinary bladder appear unremarkable as well. Stomach/Bowel: There is moderate amount of stool throughout the colon. There is no bowel dilatation or evidence of obstruction. The appendix is normal. Vascular/Lymphatic: The abdominal aorta and IVC are unremarkable. No portal venous gas. There is no adenopathy. Reproductive: The prostate and seminal vesicles are grossly unremarkable. No pelvic mass. Other: None Musculoskeletal: No acute or significant osseous findings. IMPRESSION: 1. No acute intra-abdominal or pelvic pathology. No bowel obstruction or active inflammation. Normal appendix. 2. Fatty liver. Electronically Signed   By: Elgie Collard M.D.   On: 04/25/2019 18:17     ____________________________________________   PROCEDURES  Procedure(s) performed:   Procedures  None  ____________________________________________   INITIAL IMPRESSION / ASSESSMENT AND PLAN / ED COURSE  Pertinent labs & imaging results that were available during my care of the patient were reviewed by me and considered in my medical decision making (see chart for details).   Patient presents to the emergency department for evaluation of hyperglycemia with left-sided abdominal pain.  VBG with pH of 7.37 and normal CBC.  CMP is pending but i-STAT CBG with blood sugar greater than 500.   Labs reviewed.  No evidence of DKA.  Blood sugar downtrending with IV fluids and insulin here.  Obtain CT abdomen pelvis with no other explanation for his abdominal pain with left-sided tenderness.  This was reviewed with no acute findings.  Discussed close PCP follow-up for additional discussion and consideration of his home insulin regimen.  No changes today.  Discussed ED return precautions. ____________________________________________  FINAL CLINICAL IMPRESSION(S) / ED DIAGNOSES  Final diagnoses:  Generalized abdominal pain  Hyperglycemia     MEDICATIONS GIVEN DURING THIS VISIT:  Medications  sodium chloride 0.9 % bolus 1,000 mL (0 mLs Intravenous Stopped 04/25/19 1830)  insulin regular (NOVOLIN R) 100 units/mL injection 10 Units (10 Units Intravenous Given 04/25/19 1748)  iohexol (OMNIPAQUE) 300 MG/ML solution 100 mL (100 mLs Intravenous Contrast Given 04/25/19 1753)     NEW OUTPATIENT MEDICATIONS STARTED DURING THIS VISIT:  Discharge Medication List as of 04/25/2019  6:38 PM    START  taking these medications   Details  dicyclomine (BENTYL) 20 MG tablet Take 1 tablet (20 mg total) by mouth 3 (three) times daily as needed for spasms (abdominal cramping)., Starting Tue 04/25/2019, Print        Note:  This document was prepared using Dragon voice recognition software and may include  unintentional dictation errors.  Alona Bene, MD, [facility] Emergency Medicine    Jazzman Loughmiller, Arlyss Repress, MD 04/25/19 2204

## 2019-04-25 NOTE — Discharge Instructions (Signed)

## 2019-04-25 NOTE — ED Triage Notes (Signed)
Since Sunday finger stick CBG @ home has been evaluated, this am >400 per pt. 15u humalog at 9am 12pm 15 u more, 1330 13u of tresciba. Now c/o left lower abd pain.

## 2019-04-26 MED FILL — Insulin Regular (Human) Inj 100 Unit/ML: INTRAMUSCULAR | Qty: 0.1 | Status: AC

## 2019-06-22 ENCOUNTER — Other Ambulatory Visit: Payer: Self-pay | Admitting: Physician Assistant

## 2019-06-22 ENCOUNTER — Ambulatory Visit (INDEPENDENT_AMBULATORY_CARE_PROVIDER_SITE_OTHER): Payer: Self-pay

## 2019-06-22 ENCOUNTER — Other Ambulatory Visit: Payer: Self-pay

## 2019-06-22 DIAGNOSIS — Z021 Encounter for pre-employment examination: Secondary | ICD-10-CM

## 2019-07-03 ENCOUNTER — Other Ambulatory Visit: Payer: Self-pay

## 2019-07-03 ENCOUNTER — Emergency Department (HOSPITAL_BASED_OUTPATIENT_CLINIC_OR_DEPARTMENT_OTHER)
Admission: EM | Admit: 2019-07-03 | Discharge: 2019-07-03 | Disposition: A | Discharge status: Home / Self Care | Payer: Self-pay | Attending: Emergency Medicine | Admitting: Emergency Medicine

## 2019-07-03 ENCOUNTER — Encounter (HOSPITAL_BASED_OUTPATIENT_CLINIC_OR_DEPARTMENT_OTHER): Payer: Self-pay | Admitting: Emergency Medicine

## 2019-07-03 DIAGNOSIS — I10 Essential (primary) hypertension: Secondary | ICD-10-CM | POA: Insufficient documentation

## 2019-07-03 DIAGNOSIS — E1065 Type 1 diabetes mellitus with hyperglycemia: Secondary | ICD-10-CM

## 2019-07-03 DIAGNOSIS — Z79899 Other long term (current) drug therapy: Secondary | ICD-10-CM | POA: Insufficient documentation

## 2019-07-03 LAB — URINALYSIS, ROUTINE W REFLEX MICROSCOPIC
Bilirubin Urine: NEGATIVE
Glucose, UA: >=500 mg/dL — AB
Hgb urine dipstick: NEGATIVE
Ketones, ur: NEGATIVE mg/dL
Leukocytes,Ua: NEGATIVE
Nitrite: NEGATIVE
Protein, ur: NEGATIVE mg/dL
Specific Gravity, Urine: <1.005 — ABNORMAL LOW (ref 1.005–1.030)
pH: 5.5 (ref 5.0–8.0)

## 2019-07-03 LAB — COMPREHENSIVE METABOLIC PANEL
ALT: 32 U/L (ref 0–44)
AST: 23 U/L (ref 15–41)
Albumin: 4.6 g/dL (ref 3.5–5.0)
Alkaline Phosphatase: 121 U/L (ref 38–126)
Anion gap: 13 (ref 5–15)
BUN: 13 mg/dL (ref 6–20)
CO2: 22 mmol/L (ref 22–32)
Calcium: 9.4 mg/dL (ref 8.9–10.3)
Chloride: 93 mmol/L — ABNORMAL LOW (ref 98–111)
Creatinine, Ser: 0.87 mg/dL (ref 0.61–1.24)
GFR calc Af Amer: >60 mL/min (ref >60)
GFR calc non Af Amer: >60 mL/min (ref >60)
Glucose, Bld: 619 mg/dL (ref 70–99)
Potassium: 4 mmol/L (ref 3.5–5.1)
Sodium: 128 mmol/L — ABNORMAL LOW (ref 135–145)
Total Bilirubin: 0.5 mg/dL (ref 0.3–1.2)
Total Protein: 8 g/dL (ref 6.5–8.1)

## 2019-07-03 LAB — CBC
HCT: 42.6 % (ref 39.0–52.0)
Hemoglobin: 15.2 g/dL (ref 13.0–17.0)
MCH: 29.6 pg (ref 26.0–34.0)
MCHC: 35.7 g/dL (ref 30.0–36.0)
MCV: 83 fL (ref 80.0–100.0)
Platelets: 170 10*3/uL (ref 150–400)
RBC: 5.13 MIL/uL (ref 4.22–5.81)
RDW: 12.7 % (ref 11.5–15.5)
WBC: 5.5 10*3/uL (ref 4.0–10.5)
nRBC: 0 % (ref 0.0–0.2)

## 2019-07-03 LAB — CBG MONITORING, ED
Glucose-Capillary: >600 mg/dL (ref 70–99)
Glucose-Capillary: 389 mg/dL — ABNORMAL HIGH (ref 70–99)

## 2019-07-03 LAB — URINALYSIS, MICROSCOPIC (REFLEX)

## 2019-07-03 MED ORDER — INSULIN LISPRO 100 UNIT/ML ~~LOC~~ SOLN: 12.0000 [IU] | Freq: Three times a day (TID) | SUBCUTANEOUS | 0 refills | Status: DC

## 2019-07-03 MED ORDER — INSULIN ASPART 100 UNIT/ML ~~LOC~~ SOLN
SUBCUTANEOUS | Status: AC
  Filled 2019-07-03: qty 1

## 2019-07-03 MED ORDER — INSULIN ASPART 100 UNIT/ML IV SOLN
10.0000 [IU] | Freq: Once | INTRAVENOUS | Status: AC
  Administered 2019-07-03: 10 [IU] via INTRAVENOUS
  Filled 2019-07-03: qty 0.1

## 2019-07-03 MED ORDER — LACTATED RINGERS IV BOLUS
2000.0000 mL | Freq: Once | INTRAVENOUS | Status: AC
  Administered 2019-07-03: 2000 mL via INTRAVENOUS

## 2019-07-03 MED ORDER — SODIUM CHLORIDE 0.9 % IV BOLUS
1000.0000 mL | Freq: Once | INTRAVENOUS | Status: AC
  Administered 2019-07-03: 1000 mL via INTRAVENOUS

## 2019-07-03 NOTE — ED Notes (Signed)
Pt states he started a keto diet ~1.5 weeks ago, had a 'cheat day' yesterday when sx began.

## 2019-07-03 NOTE — Discharge Instructions (Signed)
The social worker should contact you to try and set up an appointment with the family doctor.  Please call the hotline number provided if they have not called within a time that you were willing to wait.  Please return to the emergency department at anytime you wish to be reevaluated.  Unfortunately social work is not available at this facility, he may want to go to a emergency department that is attached to a hospital.

## 2019-07-03 NOTE — ED Notes (Signed)
Date and time results received: 07/03/19 2037 (use smartphrase ".now" to insert current time)  Test: glucose Critical Value: 619  Name of Provider Notified: Dr. Adela Lank  Orders Received? Or Actions Taken?: no new orders

## 2019-07-03 NOTE — Progress Notes (Addendum)
TOC CM spoke to pt and states he lost his insurance when he lost his job. He has been using his families insulin. He prefers to go to a clinic in [place]. Explained that [facility] does see pt without insurance on a sliding scale. Explained MATCH letter and once per year use with a $3 copay. Explained Evaristo Bury may not fit under the [facility] program. TOC CM will continue to follow for dc needs.Isidoro Donning RN CCM, [facility] ED TOC CM [phone]  07/04/2019 4:30 TOC CM provided pt with information for [facility]. He will have to go pick application and complete. Then the clinic will arrange an appt for pt to see a PCP. Provided pt with a MATCH, he will need a pay $3 copay and it will be a once per year use. Isidoro Donning RN CCM, [facility] TOC CM [phone]

## 2019-07-03 NOTE — ED Provider Notes (Signed)
[facility] EMERGENCY DEPARTMENT Provider Note   CSN: [id number] Arrival date & time: 07/03/19  1907     History Chief Complaint  Patient presents with  . Hyperglycemia    Stanley James is a 29 y.o. male29 yo M.  29 yo M with a chief complaints of hyperglycemia.  This is been going on for a couple days.  Patient states he went to sleep last night and noticed his blood sugar was in the 500s and when he woke up it was measured as high.  Patient has been slowly running out of insulin as he no longer has family physician to follow-up with and is worried about his follow-up.  He has had some diarrhea but that resolved.  Denies abdominal pain denies fevers denies chest pain cough shortness of breath.   The history is provided by the patient.  Hyperglycemia Associated symptoms: increased thirst and polyuria   Associated symptoms: no abdominal pain, no chest pain, no confusion, no fever, no shortness of breath and no vomiting   Illness Severity:  Moderate Onset quality:  Gradual Duration:  1 day Timing:  Constant Progression:  Unchanged Chronicity:  Recurrent Associated symptoms: no abdominal pain, no chest pain, no congestion, no diarrhea, no fever, no headaches, no myalgias, no rash, no shortness of breath and no vomiting        Past Medical History:  Diagnosis Date  . Diabetes mellitus without complication ([place])   . Hypertension     There are no problems to display for this patient.   History reviewed. No pertinent surgical history.     History reviewed. No pertinent family history.  Social History   Tobacco Use  . Smoking status: Former Research scientist (life sciences)  . Smokeless tobacco: Never Used  Substance Use Topics  . Alcohol use: Never  . Drug use: Never    Home Medications Prior to Admission medications   Medication Sig Start Date End Date Taking? Authorizing Provider  fenofibrate (TRICOR) 145 MG tablet Take by mouth. 08/03/17  Yes [provider]    lisinopril (ZESTRIL) 20 MG tablet Take by mouth. 08/03/17  Yes [provider]  QUEtiapine (SEROQUEL) 200 MG tablet Take by mouth. 08/16/17  Yes [provider]  dicyclomine (BENTYL) 20 MG tablet Take 1 tablet (20 mg total) by mouth 3 (three) times daily as needed for spasms (abdominal cramping). 04/25/19   Long, Wonda Olds, MD  Insulin Degludec (TRESIBA) 100 UNIT/ML SOLN Inject 50 Units into the skin.    [provider]  insulin lispro (HUMALOG) 100 UNIT/ML injection Inject 0.12 mLs (12 Units total) into the skin 3 (three) times daily before meals. 07/03/19   Deno Etienne, DO  ondansetron (ZOFRAN ODT) 4 MG disintegrating tablet Take 1 tablet (4 mg total) by mouth every 8 (eight) hours as needed. 04/25/19   Long, Wonda Olds, MD    Allergies    Patient has no known allergies.  Review of Systems   Review of Systems  Constitutional: Negative for chills and fever.  HENT: Negative for congestion and facial swelling.   Eyes: Negative for discharge and visual disturbance.  Respiratory: Negative for shortness of breath.   Cardiovascular: Negative for chest pain and palpitations.  Gastrointestinal: Negative for abdominal pain, diarrhea and vomiting.  Endocrine: Positive for polydipsia, polyphagia and polyuria.  Musculoskeletal: Negative for arthralgias and myalgias.  Skin: Negative for color change and rash.  Neurological: Negative for tremors, syncope and headaches.  Psychiatric/Behavioral: Negative for confusion and dysphoric mood.  Physical Exam Updated Vital Signs BP (!) 148/87 (BP Location: Right Arm)   Pulse 90   Temp 98.4 F (36.9 C) (Oral)   Resp 20   Ht 5\' 8"  (1.727 m)   Wt 82.8 kg   SpO2 98%   BMI 27.76 kg/m   Physical Exam Vitals and nursing note reviewed.  Constitutional:      Appearance: He is well-developed.  HENT:     Head: Normocephalic and atraumatic.  Eyes:     Pupils: Pupils are equal, round, and reactive to light.  Neck:     Vascular: No  JVD.  Cardiovascular:     Rate and Rhythm: Normal rate and regular rhythm.     Heart sounds: No murmur. No friction rub. No gallop.   Pulmonary:     Effort: No respiratory distress.     Breath sounds: No wheezing.  Abdominal:     General: There is no distension.     Tenderness: There is no abdominal tenderness. There is no guarding or rebound.  Musculoskeletal:        General: Normal range of motion.     Cervical back: Normal range of motion and neck supple.  Skin:    Coloration: Skin is not pale.     Findings: No rash.  Neurological:     Mental Status: He is alert and oriented to person, place, and time.  Psychiatric:        Behavior: Behavior normal.     ED Results / Procedures / Treatments   Labs (all labs ordered are listed, but only abnormal results are displayed) Labs Reviewed  URINALYSIS, ROUTINE W REFLEX MICROSCOPIC - Abnormal; Notable for the following components:      Result Value   Specific Gravity, Urine <1.005 (*)    Glucose, UA >=500 (*)    All other components within normal limits  COMPREHENSIVE METABOLIC PANEL - Abnormal; Notable for the following components:   Sodium 128 (*)    Chloride 93 (*)    Glucose, Bld 619 (*)    All other components within normal limits  URINALYSIS, MICROSCOPIC (REFLEX) - Abnormal; Notable for the following components:   Bacteria, UA FEW (*)    All other components within normal limits  CBG MONITORING, ED - Abnormal; Notable for the following components:   Glucose-Capillary >600 (*)    All other components within normal limits  CBG MONITORING, ED - Abnormal; Notable for the following components:   Glucose-Capillary 389 (*)    All other components within normal limits  CBC    EKG None  Radiology No results found.  Procedures Procedures (including critical care time)  Medications Ordered in ED Medications  sodium chloride 0.9 % bolus 1,000 mL (0 mLs Intravenous Stopped 07/03/19 2004)  lactated ringers bolus 2,000 mL (  Intravenous Stopped 07/03/19 2211)  insulin aspart (novoLOG) injection 10 Units (10 Units Intravenous Given 07/03/19 2238)    ED Course  I have reviewed the triage vital signs and the nursing notes.  Pertinent labs & imaging results that were available during my care of the patient were reviewed by me and considered in my medical decision making (see chart for details).    MDM Rules/Calculators/A&P                      29 yo M with a chief complaints of hyperglycemia.  Lab work with hyperglycemia without acidosis or ketones.  Will give 2 L of IV fluids given insulin bolus.  Patient was concerned that he has no follow-up as an outpatient.  Consult placed to social work.  Blood sugars improved significantly with IV fluids.  We will give a bolus of insulin here.  We will have him follow-up with family doctor.  Social work had contacted me that they should reach out to him for an appointment and help with his medications.  11:15 PM:  I have discussed the diagnosis/risks/treatment options with the patient and believe the pt to be eligible for discharge home to follow-up with PCP. We also discussed returning to the ED immediately if new or worsening sx occur. We discussed the sx which are most concerning (e.g., sudden worsening pain, fever, inability to tolerate by mouth) that necessitate immediate return. Medications administered to the patient during their visit and any new prescriptions provided to the patient are listed below.  Medications given during this visit Medications  sodium chloride 0.9 % bolus 1,000 mL (0 mLs Intravenous Stopped 07/03/19 2004)  lactated ringers bolus 2,000 mL ( Intravenous Stopped 07/03/19 2211)  insulin aspart (novoLOG) injection 10 Units (10 Units Intravenous Given 07/03/19 2238)     The patient appears reasonably screen and/or stabilized for discharge and I doubt any other medical condition or other [facility] requiring further screening, evaluation, or treatment in the ED  at this time prior to discharge.   Final Clinical Impression(s) / ED Diagnoses Final diagnoses:  Hyperglycemia due to type 1 diabetes mellitus (HCC)    Rx / DC Orders ED Discharge Orders         Ordered    insulin lispro (HUMALOG) 100 UNIT/ML injection  3 times daily before meals     07/03/19 2223           Melene Plan, DO 07/03/19 2315

## 2019-07-03 NOTE — ED Notes (Signed)
ED Provider at bedside. 

## 2019-07-03 NOTE — ED Triage Notes (Signed)
Patient presents with complaints of hyperglycemia this am; states meter read HI this am; states gave dose of sliding scale insulin; next reading was 584 after insulin dose; states meter then continued to read HI today regardless of giving multiple insulin doses. States diarrhea onset yesterday. Complains of dizziness today as well. Denies vomiting.

## 2019-07-04 NOTE — Progress Notes (Addendum)
  MATCH Medication Assistance Card  Name: Stanley James  ID (MRN): [id number] Bin: [id number] RX Group: [id number]  Discharge Date:07/03/2019 10:43 PM  Expiration Date:07/11/2019  (must be filled within 7 days of discharge)     Dear Stanley James:  You have been approved to have the prescriptions written by your discharging physician filled through our [facility] (Medication Assistance Through [facility]) program. This program allows for a one-time (no refills) 34-day supply of selected medications for a low copay amount.  The copay is $3.00 per prescription. For instance, if you have one prescription, you will pay $3.00; for two prescriptions, you pay $6.00; for three prescriptions, you pay $9.00; and so on.  Only certain pharmacies are participating in this program with [facility]. You will need to select one of the pharmacies from the attached list and take your prescriptions, this letter, and your photo ID to one of the participating pharmacies.   We are excited that you are able to use the [facility] program to get your medications. These prescriptions must be filled within 7 days of hospital discharge or they will no longer be valid for the [facility] program. Should you have any problems with your prescriptions please contact your case management team member at [phone]  for [place].   Thank you,    [facility]

## 2020-03-26 ENCOUNTER — Other Ambulatory Visit: Payer: Self-pay

## 2020-03-26 ENCOUNTER — Emergency Department (HOSPITAL_BASED_OUTPATIENT_CLINIC_OR_DEPARTMENT_OTHER)
Admission: EM | Admit: 2020-03-26 | Discharge: 2020-03-26 | Disposition: A | Discharge status: Home / Self Care | Payer: Self-pay | Attending: Emergency Medicine | Admitting: Emergency Medicine

## 2020-03-26 ENCOUNTER — Encounter (HOSPITAL_BASED_OUTPATIENT_CLINIC_OR_DEPARTMENT_OTHER): Payer: Self-pay

## 2020-03-26 DIAGNOSIS — Z794 Long term (current) use of insulin: Secondary | ICD-10-CM | POA: Insufficient documentation

## 2020-03-26 DIAGNOSIS — Z87891 Personal history of nicotine dependence: Secondary | ICD-10-CM | POA: Insufficient documentation

## 2020-03-26 DIAGNOSIS — I10 Essential (primary) hypertension: Secondary | ICD-10-CM | POA: Insufficient documentation

## 2020-03-26 DIAGNOSIS — Z79899 Other long term (current) drug therapy: Secondary | ICD-10-CM | POA: Insufficient documentation

## 2020-03-26 DIAGNOSIS — E1065 Type 1 diabetes mellitus with hyperglycemia: Secondary | ICD-10-CM | POA: Insufficient documentation

## 2020-03-26 LAB — COMPREHENSIVE METABOLIC PANEL
ALT: 37 U/L (ref 0–44)
AST: 24 U/L (ref 15–41)
Albumin: 4.6 g/dL (ref 3.5–5.0)
Alkaline Phosphatase: 92 U/L (ref 38–126)
Anion gap: 11 (ref 5–15)
BUN: 15 mg/dL (ref 6–20)
CO2: 25 mmol/L (ref 22–32)
Calcium: 9.6 mg/dL (ref 8.9–10.3)
Chloride: 96 mmol/L — ABNORMAL LOW (ref 98–111)
Creatinine, Ser: 0.6 mg/dL — ABNORMAL LOW (ref 0.61–1.24)
GFR, Estimated: >60 mL/min (ref >60)
Glucose, Bld: 336 mg/dL — ABNORMAL HIGH (ref 70–99)
Potassium: 4.5 mmol/L (ref 3.5–5.1)
Sodium: 132 mmol/L — ABNORMAL LOW (ref 135–145)
Total Bilirubin: 0.9 mg/dL (ref 0.3–1.2)
Total Protein: 8.1 g/dL (ref 6.5–8.1)

## 2020-03-26 LAB — CBC WITH DIFFERENTIAL/PLATELET
Abs Immature Granulocytes: 0.02 10*3/uL (ref 0.00–0.07)
Basophils Absolute: 0.1 10*3/uL (ref 0.0–0.1)
Basophils Relative: 1 %
Eosinophils Absolute: 0.1 10*3/uL (ref 0.0–0.5)
Eosinophils Relative: 2 %
HCT: 44.6 % (ref 39.0–52.0)
Hemoglobin: 16 g/dL (ref 13.0–17.0)
Immature Granulocytes: 0 %
Lymphocytes Relative: 41 %
Lymphs Abs: 2.6 10*3/uL (ref 0.7–4.0)
MCH: 29.7 pg (ref 26.0–34.0)
MCHC: 35.9 g/dL (ref 30.0–36.0)
MCV: 82.9 fL (ref 80.0–100.0)
Monocytes Absolute: 0.3 10*3/uL (ref 0.1–1.0)
Monocytes Relative: 5 %
Neutro Abs: 3.2 10*3/uL (ref 1.7–7.7)
Neutrophils Relative %: 51 %
Platelets: 190 10*3/uL (ref 150–400)
RBC: 5.38 MIL/uL (ref 4.22–5.81)
RDW: 12.6 % (ref 11.5–15.5)
WBC: 6.3 10*3/uL (ref 4.0–10.5)
nRBC: 0 % (ref 0.0–0.2)

## 2020-03-26 LAB — URINALYSIS, MICROSCOPIC (REFLEX)

## 2020-03-26 LAB — URINALYSIS, ROUTINE W REFLEX MICROSCOPIC
Bilirubin Urine: NEGATIVE
Glucose, UA: >=500 mg/dL — AB
Hgb urine dipstick: NEGATIVE
Ketones, ur: 40 mg/dL — AB
Leukocytes,Ua: NEGATIVE
Nitrite: NEGATIVE
Protein, ur: NEGATIVE mg/dL
Specific Gravity, Urine: 1.025 (ref 1.005–1.030)
pH: 5 (ref 5.0–8.0)

## 2020-03-26 LAB — CBG MONITORING, ED
Glucose-Capillary: 334 mg/dL — ABNORMAL HIGH (ref 70–99)
Glucose-Capillary: 264 mg/dL — ABNORMAL HIGH (ref 70–99)

## 2020-03-26 MED ORDER — SODIUM CHLORIDE 0.9 % IV BOLUS
1000.0000 mL | Freq: Once | INTRAVENOUS | Status: AC
  Administered 2020-03-26: 1000 mL via INTRAVENOUS

## 2020-03-26 NOTE — ED Notes (Addendum)
EDP at bedside and Pt reported having additional s/s of increaed urination and thirst since yesterday and also a fruity taste. Pt also reports that once his machine said critical high her took 12 units of insulin PTA.

## 2020-03-26 NOTE — Discharge Instructions (Addendum)
Continue hydrating with water.  Monitor your blood sugars closely and treat as needed.  Return for worsening symptoms. Follow with your primary care provider

## 2020-03-26 NOTE — ED Provider Notes (Signed)
[facility] EMERGENCY DEPARTMENT Provider Note   CSN: [id number] Arrival date & time: 03/26/20  3976     History Chief Complaint  Patient presents with  . Hyperglycemia    Stanley James is a 30 y.o. male w PMHx type 1 diabetes, hypertension, presenting to the emergency department with hyperglycemia.  States symptoms began yesterday with increased thirst and polyuria.  He states his sugars normally run in the 200s, since last week after a cold they have been running in the 300s.  This morning he checked his blood sugar and it was reading as "high."  He treated with 12 units of Humalog and came to the ED for evaluation.  He states he does not feel like he is in DKA those until his sugars elevated.  No recent steroid use.  No fevers, nausea, vomiting, abdominal pain, or persisting URI symptoms.  He monitors his blood sugars regularly and takes his insulin as directed, sliding scale with Tresiba at bedtime.  The history is provided by the patient.       Past Medical History:  Diagnosis Date  . Diabetes mellitus without complication (HCC)   . Hypertension     There are no problems to display for this patient.   History reviewed. No pertinent surgical history.     History reviewed. No pertinent family history.  Social History   Tobacco Use  . Smoking status: Former Games developer  . Smokeless tobacco: Never Used  Vaping Use  . Vaping Use: Never used  Substance Use Topics  . Alcohol use: Never  . Drug use: Never    Home Medications Prior to Admission medications   Medication Sig Start Date End Date Taking? Authorizing Provider  dicyclomine (BENTYL) 20 MG tablet Take 1 tablet (20 mg total) by mouth 3 (three) times daily as needed for spasms (abdominal cramping). 04/25/19   Long, Arlyss Repress, MD  fenofibrate (TRICOR) 145 MG tablet Take by mouth. 08/03/17   [provider]  Insulin Degludec (TRESIBA) 100 UNIT/ML SOLN Inject 50 Units into the skin.    [provider]  insulin lispro (HUMALOG) 100 UNIT/ML injection Inject 0.12 mLs (12 Units total) into the skin 3 (three) times daily before meals. 07/03/19   Melene Plan, DO  lisinopril (ZESTRIL) 20 MG tablet Take by mouth. 08/03/17   [provider]  ondansetron (ZOFRAN ODT) 4 MG disintegrating tablet Take 1 tablet (4 mg total) by mouth every 8 (eight) hours as needed. 04/25/19   Long, Arlyss Repress, MD  QUEtiapine (SEROQUEL) 200 MG tablet Take by mouth. 08/16/17   [provider]    Allergies    Patient has no known allergies.  Review of Systems   Review of Systems  Endocrine: Positive for polydipsia and polyuria.  All other systems reviewed and are negative.   Physical Exam Updated Vital Signs BP (!) 118/56   Pulse 66   Temp 98.1 F (36.7 C) (Oral)   Resp 14   Ht 5\' 8"  (1.727 m)   Wt 79.4 kg   SpO2 100%   BMI 26.61 kg/m   Physical Exam Vitals and nursing note reviewed.  Constitutional:      General: He is not in acute distress.    Appearance: He is well-developed and well-nourished.  HENT:     Head: Normocephalic and atraumatic.     Mouth/Throat:     Mouth: Mucous membranes are moist.  Eyes:     Conjunctiva/sclera: Conjunctivae normal.  Cardiovascular:  Rate and Rhythm: Normal rate and regular rhythm.  Pulmonary:     Effort: Pulmonary effort is normal. No respiratory distress.     Breath sounds: Normal breath sounds.  Abdominal:     General: Bowel sounds are normal.     Palpations: Abdomen is soft.     Tenderness: There is no abdominal tenderness. There is no guarding or rebound.  Skin:    General: Skin is warm.  Neurological:     Mental Status: He is alert.  Psychiatric:        Mood and Affect: Mood and affect normal.        Behavior: Behavior normal.     ED Results / Procedures / Treatments   Labs (all labs ordered are listed, but only abnormal results are displayed) Labs Reviewed  COMPREHENSIVE METABOLIC PANEL - Abnormal; Notable for the  following components:      Result Value   Sodium 132 (*)    Chloride 96 (*)    Glucose, Bld 336 (*)    Creatinine, Ser 0.60 (*)    All other components within normal limits  URINALYSIS, ROUTINE W REFLEX MICROSCOPIC - Abnormal; Notable for the following components:   Glucose, UA >=500 (*)    Ketones, ur 40 (*)    All other components within normal limits  URINALYSIS, MICROSCOPIC (REFLEX) - Abnormal; Notable for the following components:   Bacteria, UA RARE (*)    All other components within normal limits  CBG MONITORING, ED - Abnormal; Notable for the following components:   Glucose-Capillary 334 (*)    All other components within normal limits  CBG MONITORING, ED - Abnormal; Notable for the following components:   Glucose-Capillary 264 (*)    All other components within normal limits  CBC WITH DIFFERENTIAL/PLATELET    EKG None  Radiology No results found.  Procedures Procedures (including critical care time)  Medications Ordered in ED Medications  sodium chloride 0.9 % bolus 1,000 mL ( Intravenous Stopped 03/26/20 1207)    ED Course  I have reviewed the triage vital signs and the nursing notes.  Pertinent labs & imaging results that were available during my care of the patient were reviewed by me and considered in my medical decision making (see chart for details).  Clinical Course as of 03/26/20 1338  Tue Mar 26, 2020  1307 Pt reports improvement in symptoms. Blood work is reassuring. Will recheck CBG with anticipated d/c if improved. [JR]    Clinical Course User Index [JR] Calayah Guadarrama, [place] N, PA-C   MDM Rules/Calculators/A&P                         Patient with type 1 diabetes, presenting for hyperglycemia, read as "high" this morning.  He treated with 12 units of Humalog prior to arrival.  On arrival his CBG is 334.  He endorses polyuria and polydipsia began yesterday.  Recent cold last week.  On evaluation he is in no acute distress, vital signs are stable.  IV  fluids ordered, laboratory work-up.  Labs are reassuring, no leukocytosis.  No significant electrolyte abnormality.  Normal bicarb and anion gap.  No evidence of DKA.  UA with glucose and 40 ketones.  Patient feels much better after IV fluids.  Recheck CBG is 264.  He feels comfortable with discharge and continued p.o. hydration.  Discharge no distress.  Discussed results, findings, treatment and follow up. Patient advised of return precautions. Patient verbalized understanding and agreed with  plan.   Final Clinical Impression(s) / ED Diagnoses Final diagnoses:  Hyperglycemia due to type 1 diabetes mellitus [facility])    Rx / DC Orders ED Discharge Orders    None       Khyler Eschmann, [place] N, PA-C 03/26/20 1338    Pollyann Savoy, MD 03/26/20 1431

## 2020-03-26 NOTE — ED Notes (Signed)
Patient denies pain and is resting comfortably.  

## 2020-03-26 NOTE — ED Triage Notes (Addendum)
Type 1 diabetic, home bs>600.  POC glucose in triage = 334

## 2020-06-12 ENCOUNTER — Emergency Department (HOSPITAL_BASED_OUTPATIENT_CLINIC_OR_DEPARTMENT_OTHER)
Admission: EM | Admit: 2020-06-12 | Discharge: 2020-06-12 | Disposition: A | Discharge status: Home / Self Care | Payer: Self-pay | Attending: Emergency Medicine | Admitting: Emergency Medicine

## 2020-06-12 ENCOUNTER — Other Ambulatory Visit (HOSPITAL_COMMUNITY): Payer: Self-pay | Admitting: Emergency Medicine

## 2020-06-12 ENCOUNTER — Encounter (HOSPITAL_BASED_OUTPATIENT_CLINIC_OR_DEPARTMENT_OTHER): Payer: Self-pay | Admitting: Emergency Medicine

## 2020-06-12 ENCOUNTER — Other Ambulatory Visit: Payer: Self-pay

## 2020-06-12 DIAGNOSIS — I10 Essential (primary) hypertension: Secondary | ICD-10-CM | POA: Insufficient documentation

## 2020-06-12 DIAGNOSIS — Z79899 Other long term (current) drug therapy: Secondary | ICD-10-CM | POA: Insufficient documentation

## 2020-06-12 DIAGNOSIS — Z87891 Personal history of nicotine dependence: Secondary | ICD-10-CM | POA: Insufficient documentation

## 2020-06-12 DIAGNOSIS — E1165 Type 2 diabetes mellitus with hyperglycemia: Secondary | ICD-10-CM | POA: Insufficient documentation

## 2020-06-12 DIAGNOSIS — Z794 Long term (current) use of insulin: Secondary | ICD-10-CM | POA: Insufficient documentation

## 2020-06-12 LAB — URINALYSIS, ROUTINE W REFLEX MICROSCOPIC
Bilirubin Urine: NEGATIVE
Glucose, UA: >=500 mg/dL — AB
Hgb urine dipstick: NEGATIVE
Ketones, ur: 15 mg/dL — AB
Leukocytes,Ua: NEGATIVE
Nitrite: NEGATIVE
Protein, ur: NEGATIVE mg/dL
Specific Gravity, Urine: 1.025 (ref 1.005–1.030)
pH: 5.5 (ref 5.0–8.0)

## 2020-06-12 LAB — BASIC METABOLIC PANEL
Anion gap: 11 (ref 5–15)
BUN: 12 mg/dL (ref 6–20)
CO2: 25 mmol/L (ref 22–32)
Calcium: 9.2 mg/dL (ref 8.9–10.3)
Chloride: 97 mmol/L — ABNORMAL LOW (ref 98–111)
Creatinine, Ser: 0.64 mg/dL (ref 0.61–1.24)
GFR, Estimated: >60 mL/min (ref >60)
Glucose, Bld: 342 mg/dL — ABNORMAL HIGH (ref 70–99)
Potassium: 4.1 mmol/L (ref 3.5–5.1)
Sodium: 133 mmol/L — ABNORMAL LOW (ref 135–145)

## 2020-06-12 LAB — CBC
HCT: 40.9 % (ref 39.0–52.0)
Hemoglobin: 14.7 g/dL (ref 13.0–17.0)
MCH: 29.2 pg (ref 26.0–34.0)
MCHC: 35.9 g/dL (ref 30.0–36.0)
MCV: 81.3 fL (ref 80.0–100.0)
Platelets: 175 10*3/uL (ref 150–400)
RBC: 5.03 MIL/uL (ref 4.22–5.81)
RDW: 12.7 % (ref 11.5–15.5)
WBC: 5.9 10*3/uL (ref 4.0–10.5)
nRBC: 0 % (ref 0.0–0.2)

## 2020-06-12 LAB — CBG MONITORING, ED
Glucose-Capillary: 297 mg/dL — ABNORMAL HIGH (ref 70–99)
Glucose-Capillary: 218 mg/dL — ABNORMAL HIGH (ref 70–99)

## 2020-06-12 LAB — URINALYSIS, MICROSCOPIC (REFLEX): Squamous Epithelial / HPF: NONE SEEN (ref 0–5)

## 2020-06-12 MED ORDER — INSULIN ASPART 100 UNIT/ML ~~LOC~~ SOLN
0.0000 [IU] | Freq: Three times a day (TID) | SUBCUTANEOUS | Status: DC
  Administered 2020-06-12: 8 [IU] via SUBCUTANEOUS

## 2020-06-12 MED ORDER — LORATADINE 10 MG PO TABS
10.0000 mg | ORAL_TABLET | Freq: Once | ORAL | Status: AC
  Administered 2020-06-12: 10 mg via ORAL
  Filled 2020-06-12: qty 1

## 2020-06-12 MED ORDER — SODIUM CHLORIDE 0.9 % IV BOLUS
1000.0000 mL | Freq: Once | INTRAVENOUS | Status: AC
  Administered 2020-06-12: 1000 mL via INTRAVENOUS

## 2020-06-12 MED ORDER — INSULIN PEN NEEDLE 32G X 4 MM MISC: 60.0000 | Freq: Two times a day (BID) | 1 refills | Status: AC

## 2020-06-12 MED ORDER — INSULIN ASPART PROT & ASPART (70-30 MIX) 100 UNIT/ML ~~LOC~~ SUSP
35.0000 [IU] | Freq: Once | SUBCUTANEOUS | Status: AC
  Administered 2020-06-12: 35 [IU] via SUBCUTANEOUS
  Filled 2020-06-12: qty 35

## 2020-06-12 MED ORDER — NOVOLIN 70/30 FLEXPEN RELION (70-30) 100 UNIT/ML ~~LOC~~ SUPN: 35.0000 [IU] | PEN_INJECTOR | Freq: Two times a day (BID) | SUBCUTANEOUS | 1 refills | Status: AC

## 2020-06-12 MED FILL — NOVOLIN 70/30 FLEXPEN (70-3: (70-30) 100 | 30 days supply | Qty: 21 | Fill #0

## 2020-06-12 MED FILL — ULTICARE PEN NDL 4MM 32G: 32G X 4 MM | 30 days supply | Qty: 100 | Fill #0

## 2020-06-12 NOTE — ED Provider Notes (Signed)
[facility] EMERGENCY DEPARTMENT Provider Note   CSN: [id number] Arrival date & time: 06/12/20  3419     History Chief Complaint  Patient presents with  . Blood Sugar Problem    Stanley James is a 30 y.o. male.  30yo M w/ PMH including HTN, IDDM who p/w hyperglycemia.  Patient reports that he ran out of insulin 3 days ago.  Since yesterday, his blood sugar has been running higher at home up to 380 this morning.  He reports associated polyuria and polydipsia.  He denies any nausea, vomiting, fevers, or recent illness.  He endorses itching of his skin but denies any urticaria.  He lost his insurance which is why he has not followed up with a PCP.  He recently started a job that will allow him insurance after 60 days.  The history is provided by the patient.       Past Medical History:  Diagnosis Date  . Diabetes mellitus without complication (HCC)   . Hypertension     There are no problems to display for this patient.   History reviewed. No pertinent surgical history.     History reviewed. No pertinent family history.  Social History   Tobacco Use  . Smoking status: Former Games developer  . Smokeless tobacco: Never Used  Vaping Use  . Vaping Use: Never used  Substance Use Topics  . Alcohol use: Never  . Drug use: Never    Home Medications Prior to Admission medications   Medication Sig Start Date End Date Taking? Authorizing Provider  fenofibrate (TRICOR) 145 MG tablet Take by mouth. 08/03/17  Yes [provider]  insulin isophane & regular human (NOVOLIN 70/30 FLEXPEN RELION) (70-30) 100 UNIT/ML KwikPen Inject 35 Units into the skin 2 (two) times daily. 06/12/20 07/12/20 Yes Schneur Crowson, Ambrose Finland, MD  Insulin Pen Needle 32G X 4 MM MISC 60 each by Does not apply route in the morning and at bedtime. 06/12/20  Yes Roneshia Drew, Ambrose Finland, MD  insulin lispro (HUMALOG) 100 UNIT/ML injection Inject 0.12 mLs (12 Units total) into the skin 3 (three) times  daily before meals. 07/03/19 06/12/20 Yes Melene Plan, DO  lisinopril (ZESTRIL) 20 MG tablet Take by mouth. 08/03/17   [provider]  QUEtiapine (SEROQUEL) 200 MG tablet Take by mouth. 08/16/17   [provider]  dicyclomine (BENTYL) 20 MG tablet Take 1 tablet (20 mg total) by mouth 3 (three) times daily as needed for spasms (abdominal cramping). 04/25/19 06/12/20  Long, Arlyss Repress, MD    Allergies    Patient has no known allergies.  Review of Systems   Review of Systems All other systems reviewed and are negative except that which was mentioned in HPI  Physical Exam Updated Vital Signs BP 126/62   Pulse 76   Temp (!) 97.5 F (36.4 C) (Oral)   Resp 14   Ht 5\' 8"  (1.727 m)   Wt 83.9 kg   SpO2 100%   BMI 28.13 kg/m   Physical Exam Vitals and nursing note reviewed.  Constitutional:      General: He is not in acute distress.    Appearance: Normal appearance.  HENT:     Head: Normocephalic and atraumatic.     Mouth/Throat:     Comments: Mildly dry mucous membranes Eyes:     Conjunctiva/sclera: Conjunctivae normal.  Cardiovascular:     Rate and Rhythm: Normal rate and regular rhythm.     Heart sounds: Normal heart sounds. No murmur heard.  Pulmonary:     Effort: Pulmonary effort is normal.     Breath sounds: Normal breath sounds.  Abdominal:     General: Abdomen is flat. Bowel sounds are normal. There is no distension.     Palpations: Abdomen is soft.     Tenderness: There is no abdominal tenderness.  Musculoskeletal:     Right lower leg: No edema.     Left lower leg: No edema.  Skin:    General: Skin is warm and dry.     Findings: No rash.  Neurological:     Mental Status: He is alert and oriented to person, place, and time.     Comments: fluent  Psychiatric:        Mood and Affect: Mood normal.        Behavior: Behavior normal.     ED Results / Procedures / Treatments   Labs (all labs ordered are listed, but only abnormal results are  displayed) Labs Reviewed  URINALYSIS, ROUTINE W REFLEX MICROSCOPIC - Abnormal; Notable for the following components:      Result Value   Glucose, UA >=500 (*)    Ketones, ur 15 (*)    All other components within normal limits  BASIC METABOLIC PANEL - Abnormal; Notable for the following components:   Sodium 133 (*)    Chloride 97 (*)    Glucose, Bld 342 (*)    All other components within normal limits  URINALYSIS, MICROSCOPIC (REFLEX) - Abnormal; Notable for the following components:   Bacteria, UA RARE (*)    All other components within normal limits  CBG MONITORING, ED - Abnormal; Notable for the following components:   Glucose-Capillary 297 (*)    All other components within normal limits  CBG MONITORING, ED - Abnormal; Notable for the following components:   Glucose-Capillary 218 (*)    All other components within normal limits  CBC    EKG None  Radiology No results found.  Procedures Procedures   Medications Ordered in ED Medications  insulin aspart (novoLOG) injection 0-15 Units (8 Units Subcutaneous Given 06/12/20 0927)  sodium chloride 0.9 % bolus 1,000 mL ( Intravenous Stopped 06/12/20 1043)  loratadine (CLARITIN) tablet 10 mg (10 mg Oral Given 06/12/20 0928)  insulin aspart protamine- aspart (NOVOLOG MIX 70/30) injection 35 Units (35 Units Subcutaneous Given 06/12/20 1037)    ED Course  I have reviewed the triage vital signs and the nursing notes.  Pertinent labs & imaging results that were available during my care of the patient were reviewed by me and considered in my medical decision making (see chart for details).    MDM Rules/Calculators/A&P                          Well appearing on exam, NAD. BG 297 on arrival.  Lab work shows normal anion gap and no evidence of DKA, normal CBC.  Gave the patient fluids and Claritin for itching as he drove himself here.  Spoke with CM regarding how pt can obtain his insulin.  She has provided a voucher so that he can  get his medications at the pharmacy here for $3 as long as the total is less than $500.  I spoke with the diabetes coordinator regarding his current insulin regimen as his medications combined are quite expensive.  She recommended changing his medicine to single agent, 70/30, at dosing of 35u BID which would be similar to previous regimen. I think this change  will benefit him in the long run if he is able to afford the medication w/ the voucher.  I have also recommended that he establish care with a community health and wellness clinic.  Repeat blood glucose after receiving first dose of medication is 218.  He is well-appearing on reassessment with normal vital signs.  I have discussed treatment change and follow-up plan with the patient who voiced understanding. Final Clinical Impression(s) / ED Diagnoses Final diagnoses:  Hyperglycemia due to diabetes mellitus (HCC)    Rx / DC Orders ED Discharge Orders         Ordered    insulin isophane & regular human (NOVOLIN 70/30 FLEXPEN RELION) (70-30) 100 UNIT/ML KwikPen  2 times daily        06/12/20 1000    Insulin Pen Needle 32G X 4 MM MISC  2 times daily        06/12/20 1000           Rudell Marlowe, Ambrose Finland, MD 06/12/20 1154

## 2020-06-12 NOTE — ED Notes (Signed)
ED Provider at bedside. 

## 2020-06-12 NOTE — ED Triage Notes (Signed)
Pt reports his toenails have been turning blue,out of insulin,cbg 380 at home.

## 2020-06-12 NOTE — Progress Notes (Signed)
TOC CM spoke to pt and his wife will pick up meds on tomorrow. Contacted [facility] pharmacy and the meds will be ready on tomorrow for $6. Pt requested CM contact his PCP, Resa Miner PA and schedule follow up appt, appt on 06/24/2020 at 930 am. His out of pocket cost is $75. Office will call pt to follow up on appt and cost. ED provider updated.  Isidoro Donning RN CCM, [facility] ED TOC CM [phone]

## 2020-06-12 NOTE — Discharge Planning (Signed)
  [facility] Medication Assistance Card Name: Mirl Hillery ID (MRN): [id number] Bin: [id number] RX Group: [id number] Discharge Date: 06/12/2020 Expiration Date:06/21/2020                                           (must be filled within 7 days of discharge)     You have been approved to have the prescriptions written by your discharging physician filled through our [facility] (Medication Assistance Through [facility]) program. This program allows for a one-time (no refills) 34-day supply of selected medications for a low copay amount.  The copay is $3.00 per prescription. For instance, if you have one prescription, you will pay $3.00; for two prescriptions, you pay $6.00; for three prescriptions, you pay $9.00; and so on.  Only certain pharmacies are participating in this program with [facility]. You will need to select one of the pharmacies from the attached list and take your prescriptions, this letter, and your photo ID to one of the [facility] pharmacies, [facility], [facility] [address] or [facility] [address].   We are excited that you are able to use the [facility] program to get your medications. These prescriptions must be filled within 7 days of hospital discharge or they will no longer be valid for the [facility] program. Should you have any problems with your prescriptions please contact your case management team member at [phone] for [place].  Thank you, [facility] Health Care Management

## 2020-06-12 NOTE — ED Notes (Signed)
Attempted iv insertion right forearm and right antecubital unsuccessfully. Melodye Ped

## 2020-10-16 ENCOUNTER — Other Ambulatory Visit: Payer: Self-pay

## 2020-10-16 ENCOUNTER — Encounter (HOSPITAL_BASED_OUTPATIENT_CLINIC_OR_DEPARTMENT_OTHER): Payer: Self-pay | Admitting: Emergency Medicine

## 2020-10-16 ENCOUNTER — Emergency Department (HOSPITAL_BASED_OUTPATIENT_CLINIC_OR_DEPARTMENT_OTHER)
Admission: EM | Admit: 2020-10-16 | Discharge: 2020-10-16 | Disposition: A | Discharge status: Home / Self Care | Payer: Self-pay | Attending: Emergency Medicine | Admitting: Emergency Medicine

## 2020-10-16 DIAGNOSIS — Z794 Long term (current) use of insulin: Secondary | ICD-10-CM | POA: Insufficient documentation

## 2020-10-16 DIAGNOSIS — T675XXA Heat exhaustion, unspecified, initial encounter: Secondary | ICD-10-CM | POA: Insufficient documentation

## 2020-10-16 DIAGNOSIS — R739 Hyperglycemia, unspecified: Secondary | ICD-10-CM

## 2020-10-16 DIAGNOSIS — Z87891 Personal history of nicotine dependence: Secondary | ICD-10-CM | POA: Insufficient documentation

## 2020-10-16 DIAGNOSIS — Z79899 Other long term (current) drug therapy: Secondary | ICD-10-CM | POA: Insufficient documentation

## 2020-10-16 DIAGNOSIS — I10 Essential (primary) hypertension: Secondary | ICD-10-CM | POA: Insufficient documentation

## 2020-10-16 DIAGNOSIS — X58XXXA Exposure to other specified factors, initial encounter: Secondary | ICD-10-CM | POA: Insufficient documentation

## 2020-10-16 DIAGNOSIS — E1165 Type 2 diabetes mellitus with hyperglycemia: Secondary | ICD-10-CM | POA: Insufficient documentation

## 2020-10-16 LAB — URINALYSIS, ROUTINE W REFLEX MICROSCOPIC
Bilirubin Urine: NEGATIVE
Glucose, UA: >=500 mg/dL — AB
Hgb urine dipstick: NEGATIVE
Ketones, ur: 15 mg/dL — AB
Leukocytes,Ua: NEGATIVE
Nitrite: NEGATIVE
Protein, ur: NEGATIVE mg/dL
Specific Gravity, Urine: 1.025 (ref 1.005–1.030)
pH: 5 (ref 5.0–8.0)

## 2020-10-16 LAB — CBC WITH DIFFERENTIAL/PLATELET
Abs Immature Granulocytes: 0.01 10*3/uL (ref 0.00–0.07)
Basophils Absolute: 0 10*3/uL (ref 0.0–0.1)
Basophils Relative: 1 %
Eosinophils Absolute: 0.1 10*3/uL (ref 0.0–0.5)
Eosinophils Relative: 2 %
HCT: 42.4 % (ref 39.0–52.0)
Hemoglobin: 15.4 g/dL (ref 13.0–17.0)
Immature Granulocytes: 0 %
Lymphocytes Relative: 43 %
Lymphs Abs: 2.3 10*3/uL (ref 0.7–4.0)
MCH: 29.7 pg (ref 26.0–34.0)
MCHC: 36.3 g/dL — ABNORMAL HIGH (ref 30.0–36.0)
MCV: 81.9 fL (ref 80.0–100.0)
Monocytes Absolute: 0.5 10*3/uL (ref 0.1–1.0)
Monocytes Relative: 9 %
Neutro Abs: 2.4 10*3/uL (ref 1.7–7.7)
Neutrophils Relative %: 45 %
Platelets: 178 10*3/uL (ref 150–400)
RBC: 5.18 MIL/uL (ref 4.22–5.81)
RDW: 13 % (ref 11.5–15.5)
WBC: 5.4 10*3/uL (ref 4.0–10.5)
nRBC: 0 % (ref 0.0–0.2)

## 2020-10-16 LAB — BASIC METABOLIC PANEL
Anion gap: 7 (ref 5–15)
BUN: 16 mg/dL (ref 6–20)
CO2: 27 mmol/L (ref 22–32)
Calcium: 8.9 mg/dL (ref 8.9–10.3)
Chloride: 99 mmol/L (ref 98–111)
Creatinine, Ser: 0.67 mg/dL (ref 0.61–1.24)
GFR, Estimated: >60 mL/min (ref >60)
Glucose, Bld: 347 mg/dL — ABNORMAL HIGH (ref 70–99)
Potassium: 4.3 mmol/L (ref 3.5–5.1)
Sodium: 133 mmol/L — ABNORMAL LOW (ref 135–145)

## 2020-10-16 LAB — URINALYSIS, MICROSCOPIC (REFLEX)

## 2020-10-16 LAB — CBG MONITORING, ED
Glucose-Capillary: 320 mg/dL — ABNORMAL HIGH (ref 70–99)
Glucose-Capillary: 290 mg/dL — ABNORMAL HIGH (ref 70–99)

## 2020-10-16 LAB — CK: Total CK: 40 U/L — ABNORMAL LOW (ref 49–397)

## 2020-10-16 MED ORDER — SODIUM CHLORIDE 0.9 % IV BOLUS
1000.0000 mL | Freq: Once | INTRAVENOUS | Status: AC
  Administered 2020-10-16: 1000 mL via INTRAVENOUS

## 2020-10-16 MED ORDER — KETOROLAC TROMETHAMINE 30 MG/ML IJ SOLN
30.0000 mg | Freq: Once | INTRAMUSCULAR | Status: AC
  Administered 2020-10-16: 30 mg via INTRAVENOUS
  Filled 2020-10-16: qty 1

## 2020-10-16 MED ORDER — INSULIN ASPART 100 UNIT/ML IJ SOLN
10.0000 [IU] | Freq: Once | INTRAMUSCULAR | Status: AC
  Administered 2020-10-16: 10 [IU] via SUBCUTANEOUS

## 2020-10-16 MED ORDER — INSULIN REGULAR HUMAN 100 UNIT/ML IJ SOLN: 10.0000 [IU] | Freq: Once | INTRAMUSCULAR | Status: DC

## 2020-10-16 NOTE — Discharge Instructions (Addendum)
Please stay indoors in the cool setting and drink plenty of fluid for the next 2 days.  Heat exhaustion complete your risk for many conditions.  This includes making your blood sugars go extremely high, which can lead to coma, and other complications.  Make sure you are checking your blood sugars at home.  If your sugars remain high, call your doctor's office to talk about your insulin dosing.   We gave you some IV fluids and insulin in the emergency department.  Resume your normal insulin dosing at home today.

## 2020-10-16 NOTE — ED Triage Notes (Signed)
Pt arrives pov with c/o concern for hyperglycemia and heat exhaustion after working outside yesterday. Pt reports fatigue, with muscle cramping

## 2020-10-16 NOTE — ED Notes (Signed)
AOx4, ambulatory to room

## 2020-10-16 NOTE — ED Provider Notes (Signed)
[facility] EMERGENCY DEPARTMENT Provider Note   CSN: [id number] Arrival date & time: 10/16/20  2683     History Chief Complaint  Patient presents with  . Hyperglycemia    Stanley James is a 30 y.o. male with a history of diabetes on insulin presenting to the ED with concern for myalgias, heat exhaustion, hyperglycemia.  The patient reports that he was working outside in the heat and humidity all day yesterday.  He said he felt exhausted last night and felt like he was not producing any sweat.  He reports this morning is having diffuse muscle cramping, headache, nausea.  He noted his blood sugars were higher than normal.  He did eat breakfast.  Did take his normal insulin this morning.  He takes insulin 70/30 35 units BID (morning and at night).    He reports a mild cough, headache, questionable fever at home.  He says he tested negative for COVID last week, and he refuses to be tested again today.  HPI     Past Medical History:  Diagnosis Date  . Diabetes mellitus without complication (HCC)   . Hypertension     There are no problems to display for this patient.   History reviewed. No pertinent surgical history.     History reviewed. No pertinent family history.  Social History   Tobacco Use  . Smoking status: Former  . Smokeless tobacco: Never  Vaping Use  . Vaping Use: Never used  Substance Use Topics  . Alcohol use: Never  . Drug use: Never    Home Medications Prior to Admission medications   Medication Sig Start Date End Date Taking? Authorizing Provider  fenofibrate (TRICOR) 145 MG tablet Take by mouth. 08/03/17   [provider]  insulin isophane & regular human (HUMULIN 70/30 MIX) (70-30) 100 UNIT/ML KwikPen INJECT 35 UNITS INTO THE SKIN 2 (TWO) TIMES DAILY. 06/12/20 06/12/21  Little, Ambrose Finland, MD  insulin isophane & regular human (NOVOLIN 70/30 FLEXPEN RELION) (70-30) 100 UNIT/ML KwikPen Inject 35 Units into the skin 2 (two) times  daily. 06/12/20 07/12/20  Little, Ambrose Finland, MD  Insulin Pen Needle 32G X 4 MM MISC 60 each by Does not apply route in the morning and at bedtime. 06/12/20   Little, Ambrose Finland, MD  Insulin Pen Needle 32G X 4 MM MISC USE TO INJECT INSULIN IN THE MORNINGS AND AT BEDTIME 06/12/20 06/12/21  Little, Ambrose Finland, MD  lisinopril (ZESTRIL) 20 MG tablet Take by mouth. 08/03/17   [provider]  QUEtiapine (SEROQUEL) 200 MG tablet Take by mouth. 08/16/17   [provider]  dicyclomine (BENTYL) 20 MG tablet Take 1 tablet (20 mg total) by mouth 3 (three) times daily as needed for spasms (abdominal cramping). 04/25/19 06/12/20  Long, Arlyss Repress, MD  insulin lispro (HUMALOG) 100 UNIT/ML injection Inject 0.12 mLs (12 Units total) into the skin 3 (three) times daily before meals. 07/03/19 06/12/20  Melene Plan, DO    Allergies    Patient has no known allergies.  Review of Systems   Review of Systems  Constitutional:  Positive for activity change, appetite change and fatigue. Negative for fever.  Eyes:  Negative for pain and visual disturbance.  Respiratory:  Negative for cough and shortness of breath.   Cardiovascular:  Negative for chest pain and palpitations.  Gastrointestinal:  Positive for nausea. Negative for abdominal pain and vomiting.  Genitourinary:  Negative for dysuria and hematuria.  Musculoskeletal:  Positive for back pain and myalgias.  Skin:  Negative for color change and rash.  Neurological:  Positive for light-headedness and headaches. Negative for syncope.  All other systems reviewed and are negative.  Physical Exam Updated Vital Signs BP 112/66 (BP Location: Right Arm)   Pulse 62   Temp 97.8 F (36.6 C) (Oral)   Resp 18   Ht 5\' 8"  (1.727 m)   Wt 79.8 kg   SpO2 100%   BMI 26.76 kg/m   Physical Exam Constitutional:      General: He is not in acute distress. HENT:     Head: Normocephalic and atraumatic.  Eyes:     Conjunctiva/sclera: Conjunctivae normal.      Pupils: Pupils are equal, round, and reactive to light.  Cardiovascular:     Rate and Rhythm: Normal rate and regular rhythm.  Pulmonary:     Effort: Pulmonary effort is normal. No respiratory distress.  Abdominal:     General: There is no distension.     Tenderness: There is no abdominal tenderness.  Skin:    General: Skin is warm and dry.  Neurological:     General: No focal deficit present.     Mental Status: He is alert. Mental status is at baseline.  Psychiatric:        Mood and Affect: Mood normal.        Behavior: Behavior normal.    ED Results / Procedures / Treatments   Labs (all labs ordered are listed, but only abnormal results are displayed) Labs Reviewed  BASIC METABOLIC PANEL - Abnormal; Notable for the following components:      Result Value   Sodium 133 (*)    Glucose, Bld 347 (*)    All other components within normal limits  CBC WITH DIFFERENTIAL/PLATELET - Abnormal; Notable for the following components:   MCHC 36.3 (*)    All other components within normal limits  CK - Abnormal; Notable for the following components:   Total CK 40 (*)    All other components within normal limits  URINALYSIS, ROUTINE W REFLEX MICROSCOPIC - Abnormal; Notable for the following components:   Glucose, UA >=500 (*)    Ketones, ur 15 (*)    All other components within normal limits  URINALYSIS, MICROSCOPIC (REFLEX) - Abnormal; Notable for the following components:   Bacteria, UA RARE (*)    All other components within normal limits  CBG MONITORING, ED - Abnormal; Notable for the following components:   Glucose-Capillary 320 (*)    All other components within normal limits  CBG MONITORING, ED - Abnormal; Notable for the following components:   Glucose-Capillary 290 (*)    All other components within normal limits    EKG None  Radiology No results found.  Procedures Procedures   Medications Ordered in ED Medications  sodium chloride 0.9 % bolus 1,000 mL (0 mLs  Intravenous Stopped 10/16/20 1026)  ketorolac (TORADOL) 30 MG/ML injection 30 mg (30 mg Intravenous Given 10/16/20 1029)  insulin aspart (novoLOG) injection 10 Units (10 Units Subcutaneous Given 10/16/20 1047)    ED Course  I have reviewed the triage vital signs and the nursing notes.  Pertinent labs & imaging results that were available during my care of the patient were reviewed by me and considered in my medical decision making (see chart for details).  Differential includes heat exhaustion first dehydration for symptomatic hyperglycemia versus other. His vitals were stable on arrival. Will place an IV, start IV fluids, check his labs here. Glucose 320 on arrival  I did recommend a COVID test to him as he does have a questionable viral syndrome, but he refused test at this time.  Clinical Course as of 10/16/20 1650  Wed Oct 16, 2020  1029 Kidney function is normal.  CK is 40.  I doubt this is rhabdo.  We will recheck his glucose after his fluids and insulin.  Otherwise he is stable for discharge [MT]  1100 Patient reports that he feels significantly better after his IV fluids. [MT]    Clinical Course User Index [MT] Sadarius Norman, Kermit Balo, MD    Final Clinical Impression(s) / ED Diagnoses Final diagnoses:  Heat exhaustion, initial encounter  Hyperglycemia    Rx / DC Orders ED Discharge Orders     None        Terald Sleeper, MD 10/16/20 1650

## 2020-10-16 NOTE — ED Notes (Signed)
CBG 320  

## 2020-10-17 ENCOUNTER — Other Ambulatory Visit (HOSPITAL_COMMUNITY): Payer: Self-pay

## 2021-01-14 ENCOUNTER — Other Ambulatory Visit: Payer: Self-pay

## 2021-01-14 ENCOUNTER — Encounter (HOSPITAL_BASED_OUTPATIENT_CLINIC_OR_DEPARTMENT_OTHER): Payer: Self-pay | Admitting: Emergency Medicine

## 2021-01-14 ENCOUNTER — Emergency Department (HOSPITAL_BASED_OUTPATIENT_CLINIC_OR_DEPARTMENT_OTHER): Payer: Self-pay

## 2021-01-14 ENCOUNTER — Emergency Department (HOSPITAL_BASED_OUTPATIENT_CLINIC_OR_DEPARTMENT_OTHER)
Admission: EM | Admit: 2021-01-14 | Discharge: 2021-01-14 | Disposition: A | Discharge status: Home / Self Care | Payer: Self-pay | Attending: Emergency Medicine | Admitting: Emergency Medicine

## 2021-01-14 DIAGNOSIS — R739 Hyperglycemia, unspecified: Secondary | ICD-10-CM

## 2021-01-14 DIAGNOSIS — Z794 Long term (current) use of insulin: Secondary | ICD-10-CM | POA: Insufficient documentation

## 2021-01-14 DIAGNOSIS — R1084 Generalized abdominal pain: Secondary | ICD-10-CM | POA: Insufficient documentation

## 2021-01-14 DIAGNOSIS — I10 Essential (primary) hypertension: Secondary | ICD-10-CM | POA: Insufficient documentation

## 2021-01-14 DIAGNOSIS — E1165 Type 2 diabetes mellitus with hyperglycemia: Secondary | ICD-10-CM | POA: Insufficient documentation

## 2021-01-14 DIAGNOSIS — Z87891 Personal history of nicotine dependence: Secondary | ICD-10-CM | POA: Insufficient documentation

## 2021-01-14 DIAGNOSIS — K5900 Constipation, unspecified: Secondary | ICD-10-CM | POA: Insufficient documentation

## 2021-01-14 DIAGNOSIS — Z79899 Other long term (current) drug therapy: Secondary | ICD-10-CM | POA: Insufficient documentation

## 2021-01-14 LAB — CBC WITH DIFFERENTIAL/PLATELET
Abs Immature Granulocytes: 0.02 10*3/uL (ref 0.00–0.07)
Basophils Absolute: 0.1 10*3/uL (ref 0.0–0.1)
Basophils Relative: 1 %
Eosinophils Absolute: 0.1 10*3/uL (ref 0.0–0.5)
Eosinophils Relative: 2 %
HCT: 43.4 % (ref 39.0–52.0)
Hemoglobin: 15.5 g/dL (ref 13.0–17.0)
Immature Granulocytes: 0 %
Lymphocytes Relative: 45 %
Lymphs Abs: 2.5 10*3/uL (ref 0.7–4.0)
MCH: 29.2 pg (ref 26.0–34.0)
MCHC: 35.7 g/dL (ref 30.0–36.0)
MCV: 81.9 fL (ref 80.0–100.0)
Monocytes Absolute: 0.4 10*3/uL (ref 0.1–1.0)
Monocytes Relative: 6 %
Neutro Abs: 2.6 10*3/uL (ref 1.7–7.7)
Neutrophils Relative %: 46 %
Platelets: 171 10*3/uL (ref 150–400)
RBC: 5.3 MIL/uL (ref 4.22–5.81)
RDW: 12.5 % (ref 11.5–15.5)
WBC: 5.5 10*3/uL (ref 4.0–10.5)
nRBC: 0 % (ref 0.0–0.2)

## 2021-01-14 LAB — URINALYSIS, ROUTINE W REFLEX MICROSCOPIC
Bilirubin Urine: NEGATIVE
Glucose, UA: >=500 mg/dL — AB
Hgb urine dipstick: NEGATIVE
Ketones, ur: 15 mg/dL — AB
Leukocytes,Ua: NEGATIVE
Nitrite: NEGATIVE
Protein, ur: NEGATIVE mg/dL
Specific Gravity, Urine: 1.015 (ref 1.005–1.030)
pH: 5 (ref 5.0–8.0)

## 2021-01-14 LAB — COMPREHENSIVE METABOLIC PANEL
ALT: 26 U/L (ref 0–44)
AST: 19 U/L (ref 15–41)
Albumin: 4.4 g/dL (ref 3.5–5.0)
Alkaline Phosphatase: 90 U/L (ref 38–126)
Anion gap: 9 (ref 5–15)
BUN: 12 mg/dL (ref 6–20)
CO2: 27 mmol/L (ref 22–32)
Calcium: 8.9 mg/dL (ref 8.9–10.3)
Chloride: 96 mmol/L — ABNORMAL LOW (ref 98–111)
Creatinine, Ser: 0.65 mg/dL (ref 0.61–1.24)
GFR, Estimated: >60 mL/min (ref >60)
Glucose, Bld: 410 mg/dL — ABNORMAL HIGH (ref 70–99)
Potassium: 4.1 mmol/L (ref 3.5–5.1)
Sodium: 132 mmol/L — ABNORMAL LOW (ref 135–145)
Total Bilirubin: 0.7 mg/dL (ref 0.3–1.2)
Total Protein: 7.4 g/dL (ref 6.5–8.1)

## 2021-01-14 LAB — I-STAT VENOUS BLOOD GAS, ED
Acid-Base Excess: 2 mmol/L (ref 0.0–2.0)
Bicarbonate: 29.1 mmol/L — ABNORMAL HIGH (ref 20.0–28.0)
Calcium, Ion: 1.24 mmol/L (ref 1.15–1.40)
HCT: 45 % (ref 39.0–52.0)
Hemoglobin: 15.3 g/dL (ref 13.0–17.0)
O2 Saturation: 25 %
Potassium: 4.3 mmol/L (ref 3.5–5.1)
Sodium: 135 mmol/L (ref 135–145)
TCO2: 31 mmol/L (ref 22–32)
pCO2, Ven: 53.7 mmHg (ref 44.0–60.0)
pH, Ven: 7.342 (ref 7.250–7.430)
pO2, Ven: 18 mmHg — CL (ref 32.0–45.0)

## 2021-01-14 LAB — LIPASE, BLOOD: Lipase: 28 U/L (ref 11–51)

## 2021-01-14 LAB — URINALYSIS, MICROSCOPIC (REFLEX)

## 2021-01-14 LAB — CBG MONITORING, ED: Glucose-Capillary: 411 mg/dL — ABNORMAL HIGH (ref 70–99)

## 2021-01-14 MED ORDER — SODIUM CHLORIDE 0.9 % IV BOLUS
1000.0000 mL | Freq: Once | INTRAVENOUS | Status: AC
  Administered 2021-01-14: 1000 mL via INTRAVENOUS

## 2021-01-14 MED ORDER — INSULIN ASPART 100 UNIT/ML IJ SOLN
8.0000 [IU] | Freq: Once | INTRAMUSCULAR | Status: AC
  Administered 2021-01-14: 8 [IU] via SUBCUTANEOUS

## 2021-01-14 NOTE — ED Notes (Signed)
Pt discharged to home. Discharge instructions have been discussed with patient and/or family members. Pt verbally acknowledges understanding d/c instructions, and endorses comprehension to checkout at registration before leaving. Pt ambulatory with steady gait to checkout

## 2021-01-14 NOTE — ED Triage Notes (Signed)
Pt c/o left sided abd pain, pain in toes bilaterally and elevated blood sugar in the 420s this morning.

## 2021-01-14 NOTE — ED Notes (Signed)
CBG 411 

## 2021-01-14 NOTE — ED Provider Notes (Addendum)
[facility] EMERGENCY DEPARTMENT Provider Note   CSN: [id number] Arrival date & time: 01/14/21  [id number]     History Chief Complaint  Patient presents with   Abdominal Pain    Stanley James is a 30 y.o. male.  The history is provided by the patient.  Abdominal Pain Pain location:  L flank, LLQ and LUQ Pain quality: aching   Pain radiates to:  Does not radiate Pain severity:  Mild Onset quality:  Gradual Duration:  3 days Timing:  Constant Progression:  Unchanged Context: not previous surgeries   Relieved by:  Nothing Worsened by:  Nothing Associated symptoms: no anorexia, no belching, no chest pain, no chills, no constipation, no cough, no dysuria, no fever, no hematuria, no shortness of breath, no sore throat and no vomiting   Risk factors: has not had multiple surgeries       Past Medical History:  Diagnosis Date   Diabetes mellitus without complication ([place])    Hypertension     There are no problems to display for this patient.   History reviewed. No pertinent surgical history.     History reviewed. No pertinent family history.  Social History   Tobacco Use   Smoking status: Former   Smokeless tobacco: Never  Scientific laboratory technician Use: Never used  Substance Use Topics   Alcohol use: Never   Drug use: Never    Home Medications Prior to Admission medications   Medication Sig Start Date End Date Taking? Authorizing Provider  fenofibrate (TRICOR) 145 MG tablet Take by mouth. 08/03/17   [provider]  insulin isophane & regular human (HUMULIN 70/30 MIX) (70-30) 100 UNIT/ML KwikPen INJECT 35 UNITS INTO THE SKIN 2 (TWO) TIMES DAILY. 06/12/20 06/12/21  Little, Wenda Overland, MD  insulin isophane & regular human (NOVOLIN 70/30 FLEXPEN RELION) (70-30) 100 UNIT/ML KwikPen Inject 35 Units into the skin 2 (two) times daily. 06/12/20 07/12/20  Little, Wenda Overland, MD  Insulin Pen Needle 32G X 4 MM MISC 60 each by Does not apply route in the  morning and at bedtime. 06/12/20   Little, Wenda Overland, MD  Insulin Pen Needle 32G X 4 MM MISC USE TO INJECT INSULIN IN THE MORNINGS AND AT BEDTIME 06/12/20 06/12/21  Little, Wenda Overland, MD  lisinopril (ZESTRIL) 20 MG tablet Take by mouth. 08/03/17   [provider]  QUEtiapine (SEROQUEL) 200 MG tablet Take by mouth. 08/16/17   [provider]  dicyclomine (BENTYL) 20 MG tablet Take 1 tablet (20 mg total) by mouth 3 (three) times daily as needed for spasms (abdominal cramping). 04/25/19 06/12/20  Long, Wonda Olds, MD  insulin lispro (HUMALOG) 100 UNIT/ML injection Inject 0.12 mLs (12 Units total) into the skin 3 (three) times daily before meals. 07/03/19 06/12/20  Deno Etienne, DO    Allergies    Patient has no known allergies.  Review of Systems   Review of Systems  Constitutional:  Negative for chills and fever.  HENT:  Negative for ear pain and sore throat.   Eyes:  Negative for pain and visual disturbance.  Respiratory:  Negative for cough and shortness of breath.   Cardiovascular:  Negative for chest pain and palpitations.  Gastrointestinal:  Positive for abdominal pain. Negative for anorexia, constipation and vomiting.  Genitourinary:  Negative for dysuria and hematuria.  Musculoskeletal:  Negative for arthralgias and back pain.  Skin:  Negative for color change and rash.  Neurological:  Negative for seizures and syncope.  All other  systems reviewed and are negative.  Physical Exam Updated Vital Signs BP 136/84   Pulse 90   Temp 97.9 F (36.6 C) (Oral)   Resp 16   Ht 5\' 8"  (1.727 m)   Wt 79.4 kg   SpO2 100%   BMI 26.61 kg/m   Physical Exam Vitals and nursing note reviewed.  Constitutional:      General: He is not in acute distress.    Appearance: He is well-developed. He is not ill-appearing.  HENT:     Head: Normocephalic and atraumatic.  Eyes:     Extraocular Movements: Extraocular movements intact.     Conjunctiva/sclera: Conjunctivae normal.      Pupils: Pupils are equal, round, and reactive to light.  Cardiovascular:     Rate and Rhythm: Normal rate and regular rhythm.     Heart sounds: Normal heart sounds. No murmur heard. Pulmonary:     Effort: Pulmonary effort is normal. No respiratory distress.     Breath sounds: Normal breath sounds.  Abdominal:     General: Abdomen is flat. There is no distension.     Palpations: Abdomen is soft.     Tenderness: There is abdominal tenderness in the left upper quadrant and left lower quadrant. There is left CVA tenderness.     Hernia: No hernia is present.  Musculoskeletal:     Cervical back: Neck supple.  Skin:    General: Skin is warm and dry.     Capillary Refill: Capillary refill takes less than 2 seconds.  Neurological:     General: No focal deficit present.     Mental Status: He is alert.  Psychiatric:        Mood and Affect: Mood normal.    ED Results / Procedures / Treatments   Labs (all labs ordered are listed, but only abnormal results are displayed) Labs Reviewed  URINALYSIS, ROUTINE W REFLEX MICROSCOPIC - Abnormal; Notable for the following components:      Result Value   Glucose, UA >=500 (*)    Ketones, ur 15 (*)    All other components within normal limits  COMPREHENSIVE METABOLIC PANEL - Abnormal; Notable for the following components:   Sodium 132 (*)    Chloride 96 (*)    Glucose, Bld 410 (*)    All other components within normal limits  URINALYSIS, MICROSCOPIC (REFLEX) - Abnormal; Notable for the following components:   Bacteria, UA RARE (*)    All other components within normal limits  CBG MONITORING, ED - Abnormal; Notable for the following components:   Glucose-Capillary 411 (*)    All other components within normal limits  I-STAT VENOUS BLOOD GAS, ED - Abnormal; Notable for the following components:   pO2, Ven 18.0 (*)    Bicarbonate 29.1 (*)    All other components within normal limits  CBC WITH DIFFERENTIAL/PLATELET  LIPASE, BLOOD  CBG  MONITORING, ED  CBG MONITORING, ED    EKG None  Radiology CT Renal Stone Study  Result Date: 01/14/2021 CLINICAL DATA:  Left flank pain EXAM: CT ABDOMEN AND PELVIS WITHOUT CONTRAST TECHNIQUE: Multidetector CT imaging of the abdomen and pelvis was performed following the standard protocol without IV contrast. COMPARISON:  None. FINDINGS: Lower chest: No acute abnormality. Hepatobiliary: Liver is enlarged measuring 20.2 cm in length. No suspicious hepatic mass identified. Gallbladder appears normal. No biliary ductal dilatation. Pancreas: Unremarkable. No pancreatic ductal dilatation or surrounding inflammatory changes. Spleen: Enlarged measuring 14.8 cm in length. Adrenals/Urinary Tract: Adrenal glands are  unremarkable. Kidneys are normal, without renal calculi, focal lesion, or hydronephrosis. Bladder is unremarkable. Stomach/Bowel: Stomach is within normal limits. Appendix appears normal. No evidence of bowel wall thickening, distention, or inflammatory changes. Large amount of retained fecal material in the colon. Vascular/Lymphatic: No significant vascular findings are present. No enlarged abdominal or pelvic lymph nodes. Reproductive: Prostate is unremarkable. Other: No abdominal wall hernia or abnormality. No abdominopelvic ascites. Musculoskeletal: No acute or significant osseous findings. IMPRESSION: 1. No nephrolithiasis or obstructive uropathy. 2. Hepatosplenomegaly. Electronically Signed   By: Jannifer Hick M.D.   On: 01/14/2021 07:46    Procedures Procedures   Medications Ordered in ED Medications  sodium chloride 0.9 % bolus 1,000 mL (1,000 mLs Intravenous New Bag/Given 01/14/21 0752)  insulin aspart (novoLOG) injection 8 Units (8 Units Subcutaneous Given 01/14/21 0803)    ED Course  I have reviewed the triage vital signs and the nursing notes.  Pertinent labs & imaging results that were available during my care of the patient were reviewed by me and considered in my medical  decision making (see chart for details).    MDM Rules/Calculators/A&P                           Aarsh Fristoe is here for lower abdominal pain/flank pain, high blood sugar.  Overall unremarkable vitals.  Tenderness mostly left side of the abdomen.  May be diverticulitis or colitis.  Suspect could be hyperglycemic related.  No nausea or vomiting.  May be constipation.  Lab work not consistent with DKA.  CT scan showed no kidney stone, no bowel obstruction, no appendicitis, no colitis.  Overall fair amount of stool burden.  Suspect symptoms secondary to constipation.  Will recommend MiraLAX.  Lab work was overall unremarkable except for blood sugar in the 400s.  This improved with IV fluids and insulin.  Recommend focus on tighter glucose control at home and hydration.  No surgical process at this time.  Discharged in good condition.  Understands return precautions.  This chart was dictated using voice recognition software.  Despite best efforts to proofread,  errors can occur which can change the documentation meaning.   Final Clinical Impression(s) / ED Diagnoses Final diagnoses:  Constipation, unspecified constipation type  Hyperglycemia  Generalized abdominal pain    Rx / DC Orders ED Discharge Orders     None        Virgina Norfolk, DO 01/14/21 1517    Virgina Norfolk, DO 01/14/21 [phone]

## 2021-01-30 ENCOUNTER — Other Ambulatory Visit (HOSPITAL_BASED_OUTPATIENT_CLINIC_OR_DEPARTMENT_OTHER): Payer: Self-pay

## 2021-01-30 MED FILL — Insulin NPH & Regular Susp Pen-Inj 100 Unit/ML (70-30): SUBCUTANEOUS | 30 days supply | Qty: 21 | Fill #0 | Status: CN

## 2021-03-03 ENCOUNTER — Encounter (HOSPITAL_BASED_OUTPATIENT_CLINIC_OR_DEPARTMENT_OTHER): Payer: Self-pay

## 2021-03-03 ENCOUNTER — Emergency Department (HOSPITAL_BASED_OUTPATIENT_CLINIC_OR_DEPARTMENT_OTHER)
Admission: EM | Admit: 2021-03-03 | Discharge: 2021-03-03 | Disposition: A | Discharge status: Home / Self Care | Payer: Self-pay | Attending: Emergency Medicine | Admitting: Emergency Medicine

## 2021-03-03 ENCOUNTER — Other Ambulatory Visit: Payer: Self-pay

## 2021-03-03 ENCOUNTER — Emergency Department (HOSPITAL_BASED_OUTPATIENT_CLINIC_OR_DEPARTMENT_OTHER): Payer: Self-pay

## 2021-03-03 ENCOUNTER — Other Ambulatory Visit (HOSPITAL_BASED_OUTPATIENT_CLINIC_OR_DEPARTMENT_OTHER): Payer: Self-pay

## 2021-03-03 DIAGNOSIS — Z79899 Other long term (current) drug therapy: Secondary | ICD-10-CM | POA: Insufficient documentation

## 2021-03-03 DIAGNOSIS — I10 Essential (primary) hypertension: Secondary | ICD-10-CM | POA: Insufficient documentation

## 2021-03-03 DIAGNOSIS — R112 Nausea with vomiting, unspecified: Secondary | ICD-10-CM

## 2021-03-03 DIAGNOSIS — R739 Hyperglycemia, unspecified: Secondary | ICD-10-CM

## 2021-03-03 DIAGNOSIS — R1012 Left upper quadrant pain: Secondary | ICD-10-CM | POA: Insufficient documentation

## 2021-03-03 DIAGNOSIS — E1165 Type 2 diabetes mellitus with hyperglycemia: Secondary | ICD-10-CM | POA: Insufficient documentation

## 2021-03-03 DIAGNOSIS — Z794 Long term (current) use of insulin: Secondary | ICD-10-CM | POA: Insufficient documentation

## 2021-03-03 DIAGNOSIS — R1032 Left lower quadrant pain: Secondary | ICD-10-CM | POA: Insufficient documentation

## 2021-03-03 DIAGNOSIS — Z87891 Personal history of nicotine dependence: Secondary | ICD-10-CM | POA: Insufficient documentation

## 2021-03-03 DIAGNOSIS — R109 Unspecified abdominal pain: Secondary | ICD-10-CM

## 2021-03-03 LAB — CBC WITH DIFFERENTIAL/PLATELET
Abs Immature Granulocytes: 0.01 10*3/uL (ref 0.00–0.07)
Basophils Absolute: 0 10*3/uL (ref 0.0–0.1)
Basophils Relative: 1 %
Eosinophils Absolute: 0.1 10*3/uL (ref 0.0–0.5)
Eosinophils Relative: 1 %
HCT: 41.4 % (ref 39.0–52.0)
Hemoglobin: 14.7 g/dL (ref 13.0–17.0)
Immature Granulocytes: 0 %
Lymphocytes Relative: 46 %
Lymphs Abs: 2.9 10*3/uL (ref 0.7–4.0)
MCH: 29.1 pg (ref 26.0–34.0)
MCHC: 35.5 g/dL (ref 30.0–36.0)
MCV: 81.8 fL (ref 80.0–100.0)
Monocytes Absolute: 0.4 10*3/uL (ref 0.1–1.0)
Monocytes Relative: 7 %
Neutro Abs: 2.8 10*3/uL (ref 1.7–7.7)
Neutrophils Relative %: 45 %
Platelets: 233 10*3/uL (ref 150–400)
RBC: 5.06 MIL/uL (ref 4.22–5.81)
RDW: 12.9 % (ref 11.5–15.5)
WBC: 6.2 10*3/uL (ref 4.0–10.5)
nRBC: 0 % (ref 0.0–0.2)

## 2021-03-03 LAB — COMPREHENSIVE METABOLIC PANEL
ALT: 27 U/L (ref 0–44)
AST: 17 U/L (ref 15–41)
Albumin: 4.1 g/dL (ref 3.5–5.0)
Alkaline Phosphatase: 84 U/L (ref 38–126)
Anion gap: 9 (ref 5–15)
BUN: 11 mg/dL (ref 6–20)
CO2: 28 mmol/L (ref 22–32)
Calcium: 9.3 mg/dL (ref 8.9–10.3)
Chloride: 98 mmol/L (ref 98–111)
Creatinine, Ser: 0.6 mg/dL — ABNORMAL LOW (ref 0.61–1.24)
GFR, Estimated: >60 mL/min (ref >60)
Glucose, Bld: 321 mg/dL — ABNORMAL HIGH (ref 70–99)
Potassium: 4 mmol/L (ref 3.5–5.1)
Sodium: 135 mmol/L (ref 135–145)
Total Bilirubin: 0.9 mg/dL (ref 0.3–1.2)
Total Protein: 7.8 g/dL (ref 6.5–8.1)

## 2021-03-03 LAB — I-STAT VENOUS BLOOD GAS, ED
Acid-Base Excess: 3 mmol/L — ABNORMAL HIGH (ref 0.0–2.0)
Bicarbonate: 29 mmol/L — ABNORMAL HIGH (ref 20.0–28.0)
Calcium, Ion: 1.2 mmol/L (ref 1.15–1.40)
HCT: 43 % (ref 39.0–52.0)
Hemoglobin: 14.6 g/dL (ref 13.0–17.0)
O2 Saturation: 54 %
Patient temperature: 98.6
Potassium: 4 mmol/L (ref 3.5–5.1)
Sodium: 134 mmol/L — ABNORMAL LOW (ref 135–145)
TCO2: 30 mmol/L (ref 22–32)
pCO2, Ven: 47 mmHg (ref 44.0–60.0)
pH, Ven: 7.398 (ref 7.250–7.430)
pO2, Ven: 29 mmHg — CL (ref 32.0–45.0)

## 2021-03-03 LAB — CBG MONITORING, ED
Glucose-Capillary: 431 mg/dL — ABNORMAL HIGH (ref 70–99)
Glucose-Capillary: 263 mg/dL — ABNORMAL HIGH (ref 70–99)

## 2021-03-03 LAB — LIPASE, BLOOD: Lipase: 24 U/L (ref 11–51)

## 2021-03-03 LAB — BETA-HYDROXYBUTYRIC ACID: Beta-Hydroxybutyric Acid: 0.22 mmol/L (ref 0.05–0.27)

## 2021-03-03 MED ORDER — SODIUM CHLORIDE 0.9 % IV BOLUS
1000.0000 mL | Freq: Once | INTRAVENOUS | Status: AC
  Administered 2021-03-03: 12:00:00 1000 mL via INTRAVENOUS

## 2021-03-03 MED ORDER — ONDANSETRON HCL 4 MG/2ML IJ SOLN
4.0000 mg | Freq: Once | INTRAMUSCULAR | Status: AC
  Administered 2021-03-03: 12:00:00 4 mg via INTRAVENOUS
  Filled 2021-03-03: qty 2

## 2021-03-03 MED ORDER — MORPHINE SULFATE (PF) 4 MG/ML IV SOLN
4.0000 mg | Freq: Once | INTRAVENOUS | Status: DC
  Filled 2021-03-03: qty 1

## 2021-03-03 MED ORDER — IOHEXOL 300 MG/ML  SOLN
100.0000 mL | Freq: Once | INTRAMUSCULAR | Status: AC | PRN
  Administered 2021-03-03: 14:00:00 100 mL via INTRAVENOUS

## 2021-03-03 MED ORDER — ONDANSETRON 4 MG PO TBDP
4.0000 mg | ORAL_TABLET | Freq: Three times a day (TID) | ORAL | 0 refills | Status: DC | PRN
Start: 2021-03-03 — End: 2021-05-19
  Filled 2021-03-03: qty 20, 7d supply, fill #0

## 2021-03-03 MED ORDER — KETOROLAC TROMETHAMINE 30 MG/ML IJ SOLN
30.0000 mg | Freq: Once | INTRAMUSCULAR | Status: AC
  Administered 2021-03-03: 14:00:00 30 mg via INTRAVENOUS
  Filled 2021-03-03: qty 1

## 2021-03-03 NOTE — ED Notes (Addendum)
Assumed care from [facility]. Patient sent to CT at this time.  1417: Patient given water and crackers at this time. No acute distress noted. Patient medicated for pain.

## 2021-03-03 NOTE — ED Provider Notes (Signed)
[facility] EMERGENCY DEPARTMENT Provider Note   CSN: [id number] Arrival date & time: 03/03/21  0957     History Chief Complaint  Patient presents with   Emesis   Hyperglycemia    Stanley James is a 30 y.o. male.  He has a history of diabetes and takes insulin.  He started with some left upper quadrant left lower quadrant abdominal pain radiating through the back 2 days ago.  Has had nausea and vomiting since yesterday.  Elevated blood sugars.  No blood in the vomitus.  1 episode of loose stools.  No fevers chills cough shortness of breath.  Has tried nothing for his symptoms.  No urinary symptoms.  1 prior episode of DKA, this does not seem similar.  The history is provided by the patient.  Emesis Severity:  Moderate Duration:  1 day Timing:  Intermittent Quality:  Stomach contents Progression:  Unchanged Chronicity:  New Recent urination:  Normal Relieved by:  None tried Worsened by:  Nothing Ineffective treatments:  None tried Associated symptoms: abdominal pain and diarrhea   Associated symptoms: no cough, no fever, no headaches and no sore throat   Abdominal pain:    Location:  LUQ and LLQ   Quality: aching     Severity:  Severe   Onset quality:  Gradual   Duration:  3 days   Timing:  Constant   Progression:  Unchanged   Chronicity:  New Hyperglycemia Associated symptoms: abdominal pain, nausea and vomiting   Associated symptoms: no chest pain, no dysuria, no fever and no shortness of breath       Past Medical History:  Diagnosis Date   Diabetes mellitus without complication ([place])    Hypertension     There are no problems to display for this patient.   History reviewed. No pertinent surgical history.     No family history on file.  Social History   Tobacco Use   Smoking status: Former   Smokeless tobacco: Never  Scientific laboratory technician Use: Never used  Substance Use Topics   Alcohol use: Never   Drug use: Never    Home  Medications Prior to Admission medications   Medication Sig Start Date End Date Taking? Authorizing Provider  fenofibrate (TRICOR) 145 MG tablet Take by mouth. 08/03/17   [provider]  insulin isophane & regular human KwikPen (HUMULIN 70/30 MIX) (70-30) 100 UNIT/ML KwikPen INJECT 35 UNITS INTO THE SKIN 2 (TWO) TIMES DAILY. 06/12/20 06/12/21  Little, Wenda Overland, MD  insulin isophane & regular human (NOVOLIN 70/30 FLEXPEN RELION) (70-30) 100 UNIT/ML KwikPen Inject 35 Units into the skin 2 (two) times daily. 06/12/20 07/12/20  Little, Wenda Overland, MD  Insulin Pen Needle 32G X 4 MM MISC 60 each by Does not apply route in the morning and at bedtime. 06/12/20   Little, Wenda Overland, MD  Insulin Pen Needle 32G X 4 MM MISC USE TO INJECT INSULIN IN THE MORNINGS AND AT BEDTIME 06/12/20 06/12/21  Little, Wenda Overland, MD  lisinopril (ZESTRIL) 20 MG tablet Take by mouth. 08/03/17   [provider]  QUEtiapine (SEROQUEL) 200 MG tablet Take by mouth. 08/16/17   [provider]  dicyclomine (BENTYL) 20 MG tablet Take 1 tablet (20 mg total) by mouth 3 (three) times daily as needed for spasms (abdominal cramping). 04/25/19 06/12/20  Long, Wonda Olds, MD  insulin lispro (HUMALOG) 100 UNIT/ML injection Inject 0.12 mLs (12 Units total) into the skin 3 (three) times daily before meals.  07/03/19 06/12/20  Deno Etienne, DO    Allergies    Patient has no known allergies.  Review of Systems   Review of Systems  Constitutional:  Negative for fever.  HENT:  Negative for sore throat.   Eyes:  Negative for visual disturbance.  Respiratory:  Negative for cough and shortness of breath.   Cardiovascular:  Negative for chest pain.  Gastrointestinal:  Positive for abdominal pain, diarrhea, nausea and vomiting.  Genitourinary:  Negative for dysuria.  Musculoskeletal:  Positive for back pain.  Skin:  Negative for rash.  Neurological:  Negative for headaches.   Physical Exam Updated Vital Signs BP 122/81  (BP Location: Left Arm)    Pulse 92    Temp 98 F (36.7 C) (Oral)    Resp 16    Ht 5\' 8"  (1.727 m)    Wt 79.8 kg    SpO2 100%    BMI 26.76 kg/m   Physical Exam Vitals and nursing note reviewed.  Constitutional:      General: He is not in acute distress.    Appearance: Normal appearance. He is well-developed.  HENT:     Head: Normocephalic and atraumatic.  Eyes:     Conjunctiva/sclera: Conjunctivae normal.  Cardiovascular:     Rate and Rhythm: Normal rate and regular rhythm.     Heart sounds: No murmur heard. Pulmonary:     Effort: Pulmonary effort is normal. No respiratory distress.     Breath sounds: Normal breath sounds.  Abdominal:     Palpations: Abdomen is soft.     Tenderness: There is abdominal tenderness (Left upper and lower quadrant). There is no guarding or rebound.  Musculoskeletal:        General: No swelling. Normal range of motion.     Cervical back: Neck supple.  Skin:    General: Skin is warm and dry.     Capillary Refill: Capillary refill takes less than 2 seconds.  Neurological:     General: No focal deficit present.     Mental Status: He is alert.  Psychiatric:        Mood and Affect: Mood normal.    ED Results / Procedures / Treatments   Labs (all labs ordered are listed, but only abnormal results are displayed) Labs Reviewed  COMPREHENSIVE METABOLIC PANEL - Abnormal; Notable for the following components:      Result Value   Glucose, Bld 321 (*)    Creatinine, Ser 0.60 (*)    All other components within normal limits  CBG MONITORING, ED - Abnormal; Notable for the following components:   Glucose-Capillary 431 (*)    All other components within normal limits  I-STAT VENOUS BLOOD GAS, ED - Abnormal; Notable for the following components:   pO2, Ven 29.0 (*)    Bicarbonate 29.0 (*)    Acid-Base Excess 3.0 (*)    Sodium 134 (*)    All other components within normal limits  CBG MONITORING, ED - Abnormal; Notable for the following components:    Glucose-Capillary 263 (*)    All other components within normal limits  LIPASE, BLOOD  CBC WITH DIFFERENTIAL/PLATELET  BETA-HYDROXYBUTYRIC ACID    EKG None  Radiology CT Abdomen Pelvis W Contrast  Result Date: 03/03/2021 CLINICAL DATA:  Left lower quadrant abdominal pain. EXAM: CT ABDOMEN AND PELVIS WITH CONTRAST TECHNIQUE: Multidetector CT imaging of the abdomen and pelvis was performed using the standard protocol following bolus administration of intravenous contrast. CONTRAST:  146mL OMNIPAQUE IOHEXOL 300 MG/ML  SOLN COMPARISON:  CT examination dated January 14, 2021 FINDINGS: Lower chest: No acute abnormality. Hepatobiliary: Liver is mildly enlarged measuring 20.1 cm in cranial caudal dimension. No focal liver abnormality is seen. No gallstones, gallbladder wall thickening, or biliary dilatation. Pancreas: Unremarkable. No pancreatic ductal dilatation or surrounding inflammatory changes. Spleen: Spleen is also mildly enlarged measuring approximately 13.9 cm in maximal oblique dimension. Adrenals/Urinary Tract: Adrenal glands are unremarkable. Kidneys are normal, without renal calculi, focal lesion, or hydronephrosis. Bladder is unremarkable. Stomach/Bowel: Stomach is within normal limits. Appendix appears normal. No evidence of bowel wall thickening, distention, or inflammatory changes. Vascular/Lymphatic: No significant vascular findings are present. No enlarged abdominal or pelvic lymph nodes. Reproductive: Prostate is unremarkable. Other: No abdominal wall hernia or abnormality. No abdominopelvic ascites. Musculoskeletal: No acute or significant osseous findings. IMPRESSION: 1.  Mild hepatosplenomegaly, unchanged. 2.  No CT evidence of acute abdominal/pelvic process. Electronically Signed   By: Larose Hires D.O.   On: 03/03/2021 13:51    Procedures Procedures   Medications Ordered in ED Medications  morphine 4 MG/ML injection 4 mg (4 mg Intravenous Not Given 03/03/21 1222)  sodium  chloride 0.9 % bolus 1,000 mL (0 mLs Intravenous Stopped 03/03/21 1435)  ondansetron (ZOFRAN) injection 4 mg (4 mg Intravenous Given 03/03/21 1222)  iohexol (OMNIPAQUE) 300 MG/ML solution 100 mL (100 mLs Intravenous Contrast Given 03/03/21 1331)  ketorolac (TORADOL) 30 MG/ML injection 30 mg (30 mg Intravenous Given 03/03/21 1407)    ED Course  I have reviewed the triage vital signs and the nursing notes.  Pertinent labs & imaging results that were available during my care of the patient were reviewed by me and considered in my medical decision making (see chart for details).  Clinical Course as of 03/03/21 1913  Mon Mar 03, 2021  1422 Work-up does not show any evidence of DKA.  CT imaging does not show any acute findings.  Blood glucose has come down to 263 after IV fluids.  Given Toradol with some improvement.  Will send home with a prescription for some Zofran and note for work. [MB]    Clinical Course User Index [MB] Terrilee Files, MD   MDM Rules/Calculators/A&P                         This patient complains of left-sided abdominal pain nausea vomiting diarrhea elevated blood sugars; this involves an extensive number of treatment Options and is a complaint that carries with it a high risk of complications and Morbidity. The differential includes gastroenteritis, diverticulitis, obstruction, DKA, metabolic derangement, colitis  I ordered, reviewed and interpreted labs, which included CBC with normal white count normal hemoglobin, chemistries normal other than elevated glucose, no anion gap, VBG with normal pH, beta hydroxybutyrate not elevated I ordered medication IV fluids and Toradol and Zofran with improvement in his symptoms I ordered imaging studies which included CT abdomen and pelvis and I independently    visualized and interpreted imaging which showed no acute findings  After the interventions stated above, I reevaluated the patient and found patient to be symptomatically  improved.  He is tolerating p.o. here.  Will prescribe Zofran.  Return instructions discussed     Final Clinical Impression(s) / ED Diagnoses Final diagnoses:  Nausea vomiting and diarrhea  Abdominal pain, unspecified abdominal location  Hyperglycemia    Rx / DC Orders ED Discharge Orders          Ordered    ondansetron (ZOFRAN-ODT) 4  MG disintegrating tablet  Every 8 hours PRN        03/03/21 1423             Hayden Rasmussen, MD 03/03/21 1914

## 2021-03-03 NOTE — Discharge Instructions (Signed)
You were seen in the emergency department for some left-sided abdominal pain nausea vomiting and diarrhea.  You had lab work that showed your blood sugar was elevated, but you are not in DKA.  CAT scan of your abdomen pelvis did not show an obvious explanation for your symptoms.  We are prescribing you some nausea medication.  Please follow-up with your primary care doctor.  Return to the emergency department if any worsening or concerning symptoms

## 2021-03-03 NOTE — ED Triage Notes (Signed)
Pt arrives ambulatory to ed with c/o pain to left side into back since Saturday with vomiting last night and elevated blood sugars at home in the 230s. Pt reports he has been taking insulin at home, 70/30, unsure of name.

## 2021-03-11 ENCOUNTER — Other Ambulatory Visit (HOSPITAL_BASED_OUTPATIENT_CLINIC_OR_DEPARTMENT_OTHER): Payer: Self-pay

## 2021-05-19 ENCOUNTER — Encounter (HOSPITAL_BASED_OUTPATIENT_CLINIC_OR_DEPARTMENT_OTHER): Payer: Self-pay

## 2021-05-19 ENCOUNTER — Other Ambulatory Visit: Payer: Self-pay

## 2021-05-19 ENCOUNTER — Emergency Department (HOSPITAL_BASED_OUTPATIENT_CLINIC_OR_DEPARTMENT_OTHER): Payer: BC Managed Care – PPO

## 2021-05-19 ENCOUNTER — Emergency Department (HOSPITAL_BASED_OUTPATIENT_CLINIC_OR_DEPARTMENT_OTHER)
Admission: EM | Admit: 2021-05-19 | Discharge: 2021-05-19 | Disposition: A | Discharge status: Home / Self Care | Payer: BC Managed Care – PPO | Attending: Emergency Medicine | Admitting: Emergency Medicine

## 2021-05-19 DIAGNOSIS — R051 Acute cough: Secondary | ICD-10-CM

## 2021-05-19 DIAGNOSIS — Z28310 Unvaccinated for covid-19: Secondary | ICD-10-CM | POA: Insufficient documentation

## 2021-05-19 DIAGNOSIS — Z87891 Personal history of nicotine dependence: Secondary | ICD-10-CM | POA: Diagnosis not present

## 2021-05-19 DIAGNOSIS — R509 Fever, unspecified: Secondary | ICD-10-CM | POA: Diagnosis not present

## 2021-05-19 DIAGNOSIS — Z794 Long term (current) use of insulin: Secondary | ICD-10-CM | POA: Diagnosis not present

## 2021-05-19 DIAGNOSIS — R0602 Shortness of breath: Secondary | ICD-10-CM | POA: Diagnosis not present

## 2021-05-19 DIAGNOSIS — Z20822 Contact with and (suspected) exposure to covid-19: Secondary | ICD-10-CM | POA: Diagnosis not present

## 2021-05-19 DIAGNOSIS — R0981 Nasal congestion: Secondary | ICD-10-CM | POA: Insufficient documentation

## 2021-05-19 DIAGNOSIS — R059 Cough, unspecified: Secondary | ICD-10-CM | POA: Diagnosis present

## 2021-05-19 DIAGNOSIS — E119 Type 2 diabetes mellitus without complications: Secondary | ICD-10-CM | POA: Insufficient documentation

## 2021-05-19 DIAGNOSIS — R1110 Vomiting, unspecified: Secondary | ICD-10-CM | POA: Diagnosis not present

## 2021-05-19 DIAGNOSIS — J069 Acute upper respiratory infection, unspecified: Secondary | ICD-10-CM

## 2021-05-19 LAB — RESP PANEL BY RT-PCR (FLU A&B, COVID) ARPGX2
Influenza A by PCR: NEGATIVE
Influenza B by PCR: NEGATIVE
SARS Coronavirus 2 by RT PCR: NEGATIVE

## 2021-05-19 MED ORDER — BENZONATATE 100 MG PO CAPS: 100.0000 mg | ORAL_CAPSULE | Freq: Three times a day (TID) | ORAL | 0 refills | Status: AC | PRN

## 2021-05-19 MED ORDER — IBUPROFEN 400 MG PO TABS
400.0000 mg | ORAL_TABLET | Freq: Once | ORAL | Status: AC
  Administered 2021-05-19: 400 mg via ORAL
  Filled 2021-05-19: qty 1

## 2021-05-19 MED ORDER — ALBUTEROL SULFATE HFA 108 (90 BASE) MCG/ACT IN AERS
2.0000 | INHALATION_SPRAY | Freq: Once | RESPIRATORY_TRACT | Status: AC
  Administered 2021-05-19: 2 via RESPIRATORY_TRACT
  Filled 2021-05-19: qty 6.7

## 2021-05-19 NOTE — ED Triage Notes (Signed)
Pt states he has had a cough x 5 days, has had nasal congestion and nosebleeds as well. Pt states the thinks he may have had a fever that broke last night. Pt A&Ox4, NAD noted. ?

## 2021-05-19 NOTE — ED Provider Notes (Signed)
Signout note ? ?31 year old gentleman with notable history of type 1 diabetes, former smoker presenting to the emergency room with concern for cough.  Slight tachycardia but otherwise normal vital signs, gave albuterol treatment, checked CXR, COVID and flu testing. ? ?Reassessed patient, heart rate in 90s, pulse ox 100%, no tachypnea, lungs clear.  His chest x-ray is unremarkable and COVID and flu testing is negative.  Suspect he has some other viral upper respiratory illness most likely.  Given well appearance, feel patient stable for discharge at this time.  In reviewing his medical history and noted his type 1 diabetes status and the initial mild tachycardia, I offered to obtain basic blood work to ensure no DKA or other electrolyte issues.  Patient denies any ongoing nausea or vomiting, states sugars are not significantly different than his normal and he would like to go home at this time.  Will discharge.  I reviewed extensive return precautions with patient and discharged. ?  ?Milagros Loll, MD ?05/19/21 [phone] ? ?

## 2021-05-19 NOTE — Discharge Instructions (Signed)
Follow-up with your primary care doctor later this week for close recheck.  If you develop any nausea, vomiting, fever, chest pain, abdominal pain, difficulty breathing or other new concerning symptom, come back to ER for reassessment. ?

## 2021-05-19 NOTE — ED Provider Notes (Signed)
?[facility] EMERGENCY DEPARTMENT ?Provider Note ? ? ?CSN: [id number] ?Arrival date & time: 05/19/21  8676 ? ?  ? ?History ? ?Chief Complaint  ?Patient presents with  ? Cough  ? ? ?Stanley James is a 31 y.o. male. ? ?The history is provided by the patient.  ?Cough ?Severity:  Moderate ?Onset quality:  Gradual ?Duration:  5 days ?Timing:  Intermittent ?Progression:  Worsening ?Chronicity:  New ?Smoker: no   ?Relieved by:  Nothing ?Worsened by:  Nothing ?Associated symptoms: chills, fever, shortness of breath and sinus congestion   ?Associated symptoms: no chest pain   ?Patient with history of diabetes presents with cough.  He reports 4 to 5 days he has had cough, congestion and intermittent epistaxis.  He also reports he had a fever yesterday.  He has had associated vomiting without diarrhea.  No chest pain, but he does report mild shortness of breath.  He denies hemoptysis ?Patient reports he quit smoking a year ago ?  ?He is not vaccinated for COVID ?Home Medications ?Prior to Admission medications   ?Medication Sig Start Date End Date Taking? Authorizing Provider  ?fenofibrate (TRICOR) 145 MG tablet Take by mouth. 08/03/17   [provider]  ?insulin isophane & regular human KwikPen (HUMULIN 70/30 MIX) (70-30) 100 UNIT/ML KwikPen INJECT 35 UNITS INTO THE SKIN 2 (TWO) TIMES DAILY. 06/12/20 06/12/21  Little, Ambrose Finland, MD  ?insulin isophane & regular human (NOVOLIN 70/30 FLEXPEN RELION) (70-30) 100 UNIT/ML KwikPen Inject 35 Units into the skin 2 (two) times daily. 06/12/20 07/12/20  Little, Ambrose Finland, MD  ?Insulin Pen Needle 32G X 4 MM MISC 60 each by Does not apply route in the morning and at bedtime. 06/12/20   Little, Ambrose Finland, MD  ?Insulin Pen Needle 32G X 4 MM MISC USE TO INJECT INSULIN IN THE MORNINGS AND AT BEDTIME 06/12/20 06/12/21  Little, Ambrose Finland, MD  ?lisinopril (ZESTRIL) 20 MG tablet Take by mouth. 08/03/17   [provider]  ?QUEtiapine (SEROQUEL) 200 MG tablet  Take by mouth. 08/16/17   [provider]  ?dicyclomine (BENTYL) 20 MG tablet Take 1 tablet (20 mg total) by mouth 3 (three) times daily as needed for spasms (abdominal cramping). 04/25/19 06/12/20  Long, Arlyss Repress, MD  ?insulin lispro (HUMALOG) 100 UNIT/ML injection Inject 0.12 mLs (12 Units total) into the skin 3 (three) times daily before meals. 07/03/19 06/12/20  Melene Plan, DO  ?   ? ?Allergies    ?Patient has no known allergies.   ? ?Review of Systems   ?Review of Systems  ?Constitutional:  Positive for chills and fever.  ?HENT:  Positive for nosebleeds.   ?Respiratory:  Positive for cough and shortness of breath.   ?Cardiovascular:  Negative for chest pain.  ?Gastrointestinal:  Positive for nausea and vomiting. Negative for diarrhea.  ? ?Physical Exam ?Updated Vital Signs ?BP (!) 132/100 (BP Location: Right Arm)   Pulse (!) 109   Temp 97.7 ?F (36.5 ?C) (Oral)   Resp 18   Ht 1.753 m (5\' 9" )   Wt 80.7 kg   SpO2 100%   BMI 26.29 kg/m?  ?Physical Exam ?CONSTITUTIONAL: Well developed/well nourished, no distress ?HEAD: Normocephalic/atraumatic ?EYES: EOMI/PERRL ?ENMT: Mucous membranes moist, no blood noted in either nare ?Uvula midline without erythema or exudate ?NECK: supple no meningeal signs ?SPINE/BACK:entire spine nontender ?CV: S1/S2 noted, no murmurs/rubs/gallops noted ?LUNGS: Deep breaths triggers coughing, scattered wheezing noted bilaterally ?No hypoxia noted ?ABDOMEN: soft, nontender ?NEURO: Pt is awake/alert/appropriate, moves all  extremitiesx4.  No facial droop.   ?EXTREMITIES: full ROM ?SKIN: warm, color normal ?PSYCH: no abnormalities of mood noted, alert and oriented to situation ? ?ED Results / Procedures / Treatments   ?Labs ?(all labs ordered are listed, but only abnormal results are displayed) ?Labs Reviewed  ?RESP PANEL BY RT-PCR (FLU A&B, COVID) ARPGX2  ? ? ?EKG ?None ? ?Radiology ?No results found. ? ?Procedures ?Procedures  ? ? ?Medications Ordered in ED ?Medications  ?albuterol  (VENTOLIN HFA) 108 (90 Base) MCG/ACT inhaler 2 puff (has no administration in time range)  ?ibuprofen (ADVIL) tablet 400 mg (400 mg Oral Given 05/19/21 0657)  ? ? ?ED Course/ Medical Decision Making/ A&P ?Clinical Course as of 05/19/21 0701  ?Mon May 19, 2021  ?0700 Signed out to dr Stevie Kern at shift change to f/u on xray and reassess [DW]  ?  ?Clinical Course User Index ?[DW] Zadie Rhine, MD  ? ?                        ?Medical Decision Making ?Amount and/or Complexity of Data Reviewed ?Radiology: ordered. ? ?Risk ?Prescription drug management. ? ? ?This patient presents to the ED for concern of cough, this involves an extensive number of treatment options, and is a complaint that carries with it a high risk of complications and morbidity.  The differential diagnosis includes pneumonia, CHF, COVID-19, influenza ? ?Comorbidities that complicate the patient evaluation: ?Patient?s presentation is complicated by their history of diabetes ? ? ?Additional history obtained: ? ?Records reviewed Care Everywhere/External Records ? ? ?Imaging Studies ordered: ?I ordered imaging studies including X-ray chest   ? ?Cardiac Monitoring: ?The patient was maintained on a cardiac monitor.  I personally viewed and interpreted the cardiac monitor which showed an underlying rhythm of:  sinus rhythm ? ?Medicines ordered and prescription drug management: ?I ordered medication including albuterol for cough ? ?Complexity of problems addressed: ?Patient?s presentation is most consistent with  acute complicated illness/injury requiring diagnostic workup ? ? ? ? ? ? ? ? ?Final Clinical Impression(s) / ED Diagnoses ?Final diagnoses:  ?Acute cough  ? ? ?Rx / DC Orders ?ED Discharge Orders   ? ? None  ? ?  ? ? ?  ?Zadie Rhine, MD ?05/19/21 0701 ? ?

## 2021-07-02 ENCOUNTER — Other Ambulatory Visit: Payer: Self-pay

## 2021-07-02 ENCOUNTER — Emergency Department (HOSPITAL_BASED_OUTPATIENT_CLINIC_OR_DEPARTMENT_OTHER)
Admission: EM | Admit: 2021-07-02 | Discharge: 2021-07-02 | Disposition: A | Discharge status: Home / Self Care | Payer: BC Managed Care – PPO | Attending: Emergency Medicine | Admitting: Emergency Medicine

## 2021-07-02 ENCOUNTER — Encounter (HOSPITAL_BASED_OUTPATIENT_CLINIC_OR_DEPARTMENT_OTHER): Payer: Self-pay

## 2021-07-02 DIAGNOSIS — E871 Hypo-osmolality and hyponatremia: Secondary | ICD-10-CM | POA: Diagnosis not present

## 2021-07-02 DIAGNOSIS — R739 Hyperglycemia, unspecified: Secondary | ICD-10-CM

## 2021-07-02 DIAGNOSIS — Z87891 Personal history of nicotine dependence: Secondary | ICD-10-CM | POA: Insufficient documentation

## 2021-07-02 DIAGNOSIS — E878 Other disorders of electrolyte and fluid balance, not elsewhere classified: Secondary | ICD-10-CM | POA: Insufficient documentation

## 2021-07-02 DIAGNOSIS — I10 Essential (primary) hypertension: Secondary | ICD-10-CM | POA: Insufficient documentation

## 2021-07-02 DIAGNOSIS — K0889 Other specified disorders of teeth and supporting structures: Secondary | ICD-10-CM | POA: Insufficient documentation

## 2021-07-02 DIAGNOSIS — E1165 Type 2 diabetes mellitus with hyperglycemia: Secondary | ICD-10-CM | POA: Insufficient documentation

## 2021-07-02 LAB — CBG MONITORING, ED
Glucose-Capillary: 354 mg/dL — ABNORMAL HIGH (ref 70–99)
Glucose-Capillary: 263 mg/dL — ABNORMAL HIGH (ref 70–99)

## 2021-07-02 LAB — BASIC METABOLIC PANEL
Anion gap: 10 (ref 5–15)
BUN: 9 mg/dL (ref 6–20)
CO2: 26 mmol/L (ref 22–32)
Calcium: 9 mg/dL (ref 8.9–10.3)
Chloride: 97 mmol/L — ABNORMAL LOW (ref 98–111)
Creatinine, Ser: 0.62 mg/dL (ref 0.61–1.24)
GFR, Estimated: >60 mL/min (ref >60)
Glucose, Bld: 335 mg/dL — ABNORMAL HIGH (ref 70–99)
Potassium: 4.2 mmol/L (ref 3.5–5.1)
Sodium: 133 mmol/L — ABNORMAL LOW (ref 135–145)

## 2021-07-02 LAB — CBC WITH DIFFERENTIAL/PLATELET
Abs Immature Granulocytes: 0.02 10*3/uL (ref 0.00–0.07)
Basophils Absolute: 0.1 10*3/uL (ref 0.0–0.1)
Basophils Relative: 1 %
Eosinophils Absolute: 0 10*3/uL (ref 0.0–0.5)
Eosinophils Relative: 1 %
HCT: 41.6 % (ref 39.0–52.0)
Hemoglobin: 14.9 g/dL (ref 13.0–17.0)
Immature Granulocytes: 0 %
Lymphocytes Relative: 32 %
Lymphs Abs: 2.4 10*3/uL (ref 0.7–4.0)
MCH: 29.4 pg (ref 26.0–34.0)
MCHC: 35.8 g/dL (ref 30.0–36.0)
MCV: 82.2 fL (ref 80.0–100.0)
Monocytes Absolute: 0.6 10*3/uL (ref 0.1–1.0)
Monocytes Relative: 8 %
Neutro Abs: 4.3 10*3/uL (ref 1.7–7.7)
Neutrophils Relative %: 58 %
Platelets: 188 10*3/uL (ref 150–400)
RBC: 5.06 MIL/uL (ref 4.22–5.81)
RDW: 13.2 % (ref 11.5–15.5)
WBC: 7.4 10*3/uL (ref 4.0–10.5)
nRBC: 0 % (ref 0.0–0.2)

## 2021-07-02 MED ORDER — OXYCODONE-ACETAMINOPHEN 5-325 MG PO TABS: 1.0000 | ORAL_TABLET | Freq: Four times a day (QID) | ORAL | 0 refills | Status: AC | PRN

## 2021-07-02 MED ORDER — IBUPROFEN 800 MG PO TABS: 800.0000 mg | ORAL_TABLET | Freq: Three times a day (TID) | ORAL | 0 refills | Status: AC | PRN

## 2021-07-02 MED ORDER — INSULIN ASPART 100 UNIT/ML IJ SOLN
8.0000 [IU] | Freq: Once | INTRAMUSCULAR | Status: AC
  Administered 2021-07-02: 8 [IU] via INTRAVENOUS

## 2021-07-02 MED ORDER — SENNOSIDES-DOCUSATE SODIUM 8.6-50 MG PO TABS: 1.0000 | ORAL_TABLET | Freq: Every evening | ORAL | 0 refills | Status: AC | PRN

## 2021-07-02 MED ORDER — SODIUM CHLORIDE 0.9 % IV BOLUS
1000.0000 mL | Freq: Once | INTRAVENOUS | Status: AC
Start: 2021-07-02 — End: 2021-07-02
  Administered 2021-07-02: 1000 mL via INTRAVENOUS

## 2021-07-02 MED ORDER — PENICILLIN V POTASSIUM 500 MG PO TABS: 500.0000 mg | ORAL_TABLET | Freq: Four times a day (QID) | ORAL | 0 refills | Status: AC

## 2021-07-02 MED ORDER — OXYCODONE-ACETAMINOPHEN 5-325 MG PO TABS
1.0000 | ORAL_TABLET | Freq: Once | ORAL | Status: AC
Start: 2021-07-02 — End: 2021-07-02
  Administered 2021-07-02: 1 via ORAL
  Filled 2021-07-02: qty 1

## 2021-07-02 NOTE — Discharge Instructions (Signed)
You were seen in the ED with dental pain. I am starting you on antibiotics and pain medication but you will need to see a dentist ASAP for evaluation and treatment. Percocet can impair your ability to drive a car. You cannot take this with alcohol or other pain/anxiety medications.  ? ?Please continue your insulin and follow closely with your PCP and Endocrinologist.  ?

## 2021-07-02 NOTE — ED Provider Notes (Signed)
? ?Emergency Department Provider Note ? ? ?I have reviewed the triage vital signs and the nursing notes. ? ? ?HISTORY ? ?Chief Complaint ?Dental Pain and Hyperglycemia ? ? ?HPI ?Stanley James is a 31 y.o. male with PMH of DM and HTN presents emergency department for evaluation of dental pain.  Patient with left upper incisor fracture/decay.  Patient has history of poor dentition. He was at work and a box slipped hitting him in the face reportedly fracturing the tooth.  He has had pain since that time.  He tried to reach out to dentist to could see him in 6 to 8 weeks.  He has had worsening pain and is noticed that blood sugar has been higher than normal.  Notes that he typically runs in the low 200 range.  He is feeling some generalized fatigue. ? ? ?Past Medical History:  ?Diagnosis Date  ? Diabetes mellitus without complication (HCC)   ? Hypertension   ? ? ?Review of Systems ? ?Constitutional: No fever/chills. Positive fatigue.  ?Eyes: No visual changes. ?ENT: No sore throat. Positive dental pain.  ?Cardiovascular: Denies chest pain. ?Respiratory: Denies shortness of breath. ?Gastrointestinal: No abdominal pain. ? ?____________________________________________ ? ? ?PHYSICAL EXAM: ? ?VITAL SIGNS: ?ED Triage Vitals  ?Enc Vitals Group  ?   BP 07/02/21 0752 (!) 152/101  ?   Pulse Rate 07/02/21 0752 93  ?   Resp 07/02/21 0752 16  ?   Temp 07/02/21 0752 98.3 ?F (36.8 ?C)  ?   Temp Source 07/02/21 0752 Oral  ?   SpO2 07/02/21 0752 99 %  ?   Weight 07/02/21 0753 170 lb (77.1 kg)  ?   Height 07/02/21 0753 5\' 10"  (1.778 m)  ? ?Constitutional: Alert and oriented. Well appearing and in no acute distress. ?Eyes: Conjunctivae are normal.  ?Head: Atraumatic. ?Nose: No congestion/rhinnorhea. ?Mouth/Throat: Mucous membranes are moist.  Oropharynx non-erythematous. No trismus. Soft submandibular compartment. #10 tooth with fracture and central decay. No visible abscess. No bleeding. Clear voice. Managing oral secretions.   ?Neck: No stridor. ?Cardiovascular: Good peripheral circulation.  ?Respiratory: Normal respiratory effort.   ?Gastrointestinal: No distention.  ?Musculoskeletal: No gross deformities of extremities. ?Neurologic:  Normal speech and language.  ?Skin:  Skin is warm, dry and intact. No rash noted. ? ?____________________________________________ ?  ?LABS ?(all labs ordered are listed, but only abnormal results are displayed) ? ?Labs Reviewed  ?BASIC METABOLIC PANEL - Abnormal; Notable for the following components:  ?    Result Value  ? Sodium 133 (*)   ? Chloride 97 (*)   ? Glucose, Bld 335 (*)   ? All other components within normal limits  ?CBG MONITORING, ED - Abnormal; Notable for the following components:  ? Glucose-Capillary 354 (*)   ? All other components within normal limits  ?CBG MONITORING, ED - Abnormal; Notable for the following components:  ? Glucose-Capillary 263 (*)   ? All other components within normal limits  ?CBC WITH DIFFERENTIAL/PLATELET  ?CBG MONITORING, ED  ? ? ?____________________________________________ ? ? ?PROCEDURES ? ?Procedure(s) performed:  ? ?Procedures ? ?None  ?____________________________________________ ? ? ?INITIAL IMPRESSION / ASSESSMENT AND PLAN / ED COURSE ? ?Pertinent labs & imaging results that were available during my care of the patient were reviewed by me and considered in my medical decision making (see chart for details). ?  ?This patient is Presenting for Evaluation of dental pain and elevated CBG, which does require a range of treatment options, and is a complaint that involves a  high risk of morbidity and mortality. ? ?The Differential Diagnoses include dental fracture, periapical abscess, Ludwig's angina, DKA, HHS, symptomatic hyperglycemia. ? ?Critical Interventions-  ?  ?Medications  ?oxyCODONE-acetaminophen (PERCOCET/ROXICET) 5-325 MG per tablet 1 tablet (1 tablet Oral Given 07/02/21 0826)  ?sodium chloride 0.9 % bolus 1,000 mL (0 mLs Intravenous Stopped 07/02/21  0918)  ?insulin aspart (novoLOG) injection 8 Units (8 Units Intravenous Given 07/02/21 0847)  ? ? ?Reassessment after intervention: Pain and CBG improving.  ? ?  ?Clinical Laboratory Tests Ordered, included CBC without leukocytosis. Hyperglycemia. Normal CO2. Anion gap of 10.  ? ?Social Determinants of Health Risk patient with a smoking history.  ? ?Medical Decision Making: Summary:  ?Patient presents emergency department for evaluation of dental pain and hyperglycemia.  No obvious evidence of infection or abscess.  No trismus.  Do not see an indication for advanced imaging of the face or neck.  Patient has dental fracture but the central tooth is decayed.  He will need close dental follow-up.  We discussed calling a dental practice for follow-up sooner than what he has been quoted so far.  Plan for labs to evaluate for underlying DKA.  Will give fluids and pain medication. ? ?Reevaluation with update and discussion with patient. Discussed abx plan and pain medication. Patient will continue to look for a dentist on his insurance plan for ASAP follow up. Discussed hyperglycemia mgmt and PCP/Endocrine follow up.  ? ?Disposition: discharge ? ?____________________________________________ ? ?FINAL CLINICAL IMPRESSION(S) / ED DIAGNOSES ? ?Final diagnoses:  ?Pain, dental  ?Hyperglycemia  ? ? ? ?NEW OUTPATIENT MEDICATIONS STARTED DURING THIS VISIT: ? ?New Prescriptions  ? IBUPROFEN (ADVIL) 800 MG TABLET    Take 1 tablet (800 mg total) by mouth every 8 (eight) hours as needed.  ? OXYCODONE-ACETAMINOPHEN (PERCOCET/ROXICET) 5-325 MG TABLET    Take 1 tablet by mouth every 6 (six) hours as needed for severe pain.  ? PENICILLIN V POTASSIUM (VEETID) 500 MG TABLET    Take 1 tablet (500 mg total) by mouth 4 (four) times daily for 7 days.  ? SENNA-DOCUSATE (SENOKOT-S) 8.6-50 MG TABLET    Take 1 tablet by mouth at bedtime as needed for moderate constipation or mild constipation.  ? ? ?Note:  This document was prepared using Dragon  voice recognition software and may include unintentional dictation errors. ? ?Alona Bene, MD, FACEP ?Emergency Medicine ? ?  ?Maia Plan, MD ?07/02/21 0932 ? ?

## 2021-07-02 NOTE — ED Triage Notes (Signed)
Pt reports infected tooth which began hurting Monday and thinks infection is increasing blood sugar.. Pt reports on augmentin in February and it gave him thrush ?

## 2021-09-05 ENCOUNTER — Emergency Department (HOSPITAL_BASED_OUTPATIENT_CLINIC_OR_DEPARTMENT_OTHER): Payer: Self-pay

## 2021-09-05 ENCOUNTER — Emergency Department (HOSPITAL_BASED_OUTPATIENT_CLINIC_OR_DEPARTMENT_OTHER)
Admission: EM | Admit: 2021-09-05 | Discharge: 2021-09-05 | Disposition: A | Discharge status: Home / Self Care | Payer: Self-pay | Attending: Emergency Medicine | Admitting: Emergency Medicine

## 2021-09-05 ENCOUNTER — Other Ambulatory Visit: Payer: Self-pay

## 2021-09-05 ENCOUNTER — Encounter (HOSPITAL_BASED_OUTPATIENT_CLINIC_OR_DEPARTMENT_OTHER): Payer: Self-pay | Admitting: *Deleted

## 2021-09-05 DIAGNOSIS — Y9301 Activity, walking, marching and hiking: Secondary | ICD-10-CM | POA: Insufficient documentation

## 2021-09-05 DIAGNOSIS — M79672 Pain in left foot: Secondary | ICD-10-CM | POA: Insufficient documentation

## 2021-09-05 DIAGNOSIS — X501XXA Overexertion from prolonged static or awkward postures, initial encounter: Secondary | ICD-10-CM | POA: Insufficient documentation

## 2021-09-05 DIAGNOSIS — Z794 Long term (current) use of insulin: Secondary | ICD-10-CM | POA: Insufficient documentation

## 2021-09-05 DIAGNOSIS — S93402A Sprain of unspecified ligament of left ankle, initial encounter: Secondary | ICD-10-CM | POA: Insufficient documentation

## 2021-09-05 DIAGNOSIS — E109 Type 1 diabetes mellitus without complications: Secondary | ICD-10-CM | POA: Insufficient documentation

## 2021-09-05 DIAGNOSIS — Y92812 Truck as the place of occurrence of the external cause: Secondary | ICD-10-CM | POA: Insufficient documentation

## 2021-09-05 MED ORDER — KETOROLAC TROMETHAMINE 15 MG/ML IJ SOLN
15.0000 mg | Freq: Once | INTRAMUSCULAR | Status: AC
  Administered 2021-09-05: 15 mg via INTRAMUSCULAR
  Filled 2021-09-05: qty 1

## 2021-09-05 NOTE — ED Notes (Signed)
Placed in exam room 4, hx obtained, pt positioned for comfort. Radiological studies ordered per protocol.

## 2021-11-27 ENCOUNTER — Other Ambulatory Visit: Payer: Self-pay

## 2021-11-27 ENCOUNTER — Encounter (HOSPITAL_BASED_OUTPATIENT_CLINIC_OR_DEPARTMENT_OTHER): Payer: Self-pay

## 2021-11-27 ENCOUNTER — Emergency Department (HOSPITAL_BASED_OUTPATIENT_CLINIC_OR_DEPARTMENT_OTHER)
Admission: EM | Admit: 2021-11-27 | Discharge: 2021-11-27 | Disposition: A | Discharge status: Home / Self Care | Payer: Medicaid Other | Attending: Emergency Medicine | Admitting: Emergency Medicine

## 2021-11-27 DIAGNOSIS — H6591 Unspecified nonsuppurative otitis media, right ear: Secondary | ICD-10-CM

## 2021-11-27 DIAGNOSIS — H60391 Other infective otitis externa, right ear: Secondary | ICD-10-CM | POA: Insufficient documentation

## 2021-11-27 DIAGNOSIS — H81399 Other peripheral vertigo, unspecified ear: Secondary | ICD-10-CM

## 2021-11-27 DIAGNOSIS — Z794 Long term (current) use of insulin: Secondary | ICD-10-CM | POA: Insufficient documentation

## 2021-11-27 DIAGNOSIS — H73891 Other specified disorders of tympanic membrane, right ear: Secondary | ICD-10-CM | POA: Insufficient documentation

## 2021-11-27 DIAGNOSIS — H81391 Other peripheral vertigo, right ear: Secondary | ICD-10-CM | POA: Insufficient documentation

## 2021-11-27 DIAGNOSIS — H5500 Unspecified nystagmus: Secondary | ICD-10-CM | POA: Insufficient documentation

## 2021-11-27 MED ORDER — CIPROFLOXACIN-DEXAMETHASONE 0.3-0.1 % OT SUSP: 4.0000 [drp] | Freq: Two times a day (BID) | OTIC | 0 refills | Status: AC

## 2021-11-27 MED ORDER — METHYLPREDNISOLONE 4 MG PO TBPK: ORAL_TABLET | ORAL | 0 refills | Status: AC

## 2021-11-27 MED ORDER — MECLIZINE HCL 25 MG PO TABS
25.0000 mg | ORAL_TABLET | Freq: Once | ORAL | Status: AC
Start: 2021-11-27 — End: 2021-11-27
  Administered 2021-11-27: 25 mg via ORAL
  Filled 2021-11-27: qty 1

## 2021-11-27 MED ORDER — CELECOXIB 200 MG PO CAPS
200.0000 mg | ORAL_CAPSULE | Freq: Two times a day (BID) | ORAL | 0 refills | Status: AC
Start: 2021-11-27 — End: ?

## 2021-11-27 MED ORDER — MECLIZINE HCL 25 MG PO TABS: 25.0000 mg | ORAL_TABLET | Freq: Three times a day (TID) | ORAL | 0 refills | Status: AC | PRN

## 2021-11-27 NOTE — ED Notes (Signed)
31yo white male presents with rt ear pain, states also having some dizziness and "balance" issues as well, has had a few episodes of vomiting with this, states has been ongoing for the past week, was seen in [place] for same complaint and was given a Rx for abx.

## 2021-11-27 NOTE — ED Provider Notes (Signed)
[facility] EMERGENCY DEPARTMENT Provider Note   CSN: [id number] Arrival date & time: 11/27/21  1011     History  Chief Complaint  Patient presents with   Otalgia    Stanley James is a 31 y.o. male presenting with a chief complain of right ear pain. He states the pain started 1 week ago. He reports he also has intermittent nausea and has been lightheaded. He was recently seen for ear pain 2 weeks ago and was treated with an antibiotic which he states did temporarily help however the pain came back once he stopped taking it. He has also tried zyrtec and had no relief. Patient states the pain is better at night when he is laying down to go to sleep but then comes back in the morning and is constant throughout the day. He describes the pain as "fullness and pressure" in the right ear. He has a PMH of vertigo. He denies drainage, fever, sore throat, rhinorrhea, or weight changes. He does report having cough, dizziness with eye and head movement, nausea vomiting, tinnitus, and some hearing loss in the effected ear.  States that dizzy sxs are the same as previous episode of vertigo. Took zofran PTA with nausea relief.   Otalgia      Home Medications Prior to Admission medications   Medication Sig Start Date End Date Taking? Authorizing Provider  benzonatate (TESSALON) 100 MG capsule Take 1 capsule (100 mg total) by mouth 3 (three) times daily as needed for cough. 05/19/21   Milagros Loll, MD  fenofibrate (TRICOR) 145 MG tablet Take by mouth. 08/03/17   [provider]  ibuprofen (ADVIL) 800 MG tablet Take 1 tablet (800 mg total) by mouth every 8 (eight) hours as needed. 07/02/21   Long, Arlyss Repress, MD  insulin isophane & regular human KwikPen (HUMULIN 70/30 MIX) (70-30) 100 UNIT/ML KwikPen INJECT 35 UNITS INTO THE SKIN 2 (TWO) TIMES DAILY. 06/12/20 06/12/21  Little, Ambrose Finland, MD  insulin isophane & regular human (NOVOLIN 70/30 FLEXPEN RELION) (70-30) 100 UNIT/ML  KwikPen Inject 35 Units into the skin 2 (two) times daily. 06/12/20 07/12/20  Little, Ambrose Finland, MD  Insulin Pen Needle 32G X 4 MM MISC 60 each by Does not apply route in the morning and at bedtime. 06/12/20   Little, Ambrose Finland, MD  lisinopril (ZESTRIL) 20 MG tablet Take by mouth. 08/03/17   [provider]  oxyCODONE-acetaminophen (PERCOCET/ROXICET) 5-325 MG tablet Take 1 tablet by mouth every 6 (six) hours as needed for severe pain. 07/02/21   Long, Arlyss Repress, MD  QUEtiapine (SEROQUEL) 200 MG tablet Take by mouth. 08/16/17   [provider]  senna-docusate (SENOKOT-S) 8.6-50 MG tablet Take 1 tablet by mouth at bedtime as needed for moderate constipation or mild constipation. 07/02/21   Long, Arlyss Repress, MD  dicyclomine (BENTYL) 20 MG tablet Take 1 tablet (20 mg total) by mouth 3 (three) times daily as needed for spasms (abdominal cramping). 04/25/19 06/12/20  Long, Arlyss Repress, MD  insulin lispro (HUMALOG) 100 UNIT/ML injection Inject 0.12 mLs (12 Units total) into the skin 3 (three) times daily before meals. 07/03/19 06/12/20  Melene Plan, DO      Allergies    Augmentin [amoxicillin-pot clavulanate]    Review of Systems   Review of Systems  HENT:  Positive for ear pain.     Physical Exam Updated Vital Signs BP 137/83 (BP Location: Left Arm)   Pulse (!) 106   Temp 98 F (36.7 C) (  Oral)   Resp 17   Ht 5\' 6"  (1.676 m)   Wt 73.1 kg   SpO2 100%   BMI 26.01 kg/m  Physical Exam Vitals and nursing note reviewed.  Constitutional:      General: He is not in acute distress.    Appearance: He is well-developed. He is not diaphoretic.  HENT:     Head: Normocephalic and atraumatic.     Comments: No sinus tenderness    Right Ear: External ear normal. Decreased hearing noted. Tenderness (tenderness with mvmt of the pinna and presure on the tragus) present. No drainage or swelling. A middle ear effusion is present. There is no impacted cerumen. No mastoid tenderness.     Left Ear:  Tympanic membrane, ear canal and external ear normal.     Ears:     Comments: + post / and auricular adenopathy on the right.  R canal swollen without drainage + mid ear effusion  Eyes:     General: No scleral icterus.    Extraocular Movements:     Right eye: No nystagmus.     Left eye: Nystagmus present.     Conjunctiva/sclera: Conjunctivae normal.  Cardiovascular:     Rate and Rhythm: Normal rate and regular rhythm.     Heart sounds: Normal heart sounds.  Pulmonary:     Effort: Pulmonary effort is normal. No respiratory distress.     Breath sounds: Normal breath sounds.  Abdominal:     Palpations: Abdomen is soft.     Tenderness: There is no abdominal tenderness.  Musculoskeletal:     Cervical back: Normal range of motion and neck supple.  Skin:    General: Skin is warm and dry.  Neurological:     Mental Status: He is alert.  Psychiatric:        Behavior: Behavior normal.     ED Results / Procedures / Treatments   Labs (all labs ordered are listed, but only abnormal results are displayed) Labs Reviewed - No data to display  EKG None  Radiology No results found.  Procedures Procedures    Medications Ordered in ED Medications  meclizine (ANTIVERT) tablet 25 mg (has no administration in time range)    ED Course/ Medical Decision Making/ A&P                           Medical Decision Making 31 year old male here with right ear pain and dizziness. Patient's dizziness is consistent with peripheral vertigo.  He has previous episodes of the sam  On physical examination patient's dizziness is worse with eye movement.   Right ear has pain with movement of the pinna, external auricular adenopathy and symptoms are more consistent with otitis externa.  He does have a clear effusion behind the right eardrum but all landmarks are present.  I suspect that he has eustachian tube dysfunction on the side more than true otitis media and will treat with Medrol Dosepak,  meclizine, Ciprodex drops and ENT referral.  No signs or symptoms of acute mastoiditis.  He appears otherwise appropriate for discharge at this time.  Given return per precautions.           Final Clinical Impression(s) / ED Diagnoses Final diagnoses:  Other infective acute otitis externa of right ear  Episodic peripheral vertigo  Fluid level behind tympanic membrane of right ear    Rx / DC Orders ED Discharge Orders     None  Margarita Mail, PA-C 11/27/21 1137    Hayden Rasmussen, MD 11/27/21 1721

## 2021-11-27 NOTE — Discharge Instructions (Addendum)
Contact a health care provider if: You have a fever. Your ear is still red, swollen, painful, or draining pus after 3 days. Your redness, swelling, or pain gets worse. You have a severe headache. Get help right away if: You have redness, swelling, and pain or tenderness in the area behind your ear.

## 2021-11-27 NOTE — ED Triage Notes (Signed)
Patient c/o right ear pain x 1 week.

## 2022-05-21 ENCOUNTER — Emergency Department (HOSPITAL_BASED_OUTPATIENT_CLINIC_OR_DEPARTMENT_OTHER)
Admission: EM | Admit: 2022-05-21 | Discharge: 2022-05-21 | Disposition: A | Discharge status: Home / Self Care | Payer: Self-pay | Attending: Emergency Medicine | Admitting: Emergency Medicine

## 2022-05-21 ENCOUNTER — Emergency Department (HOSPITAL_BASED_OUTPATIENT_CLINIC_OR_DEPARTMENT_OTHER): Payer: Self-pay

## 2022-05-21 ENCOUNTER — Encounter (HOSPITAL_BASED_OUTPATIENT_CLINIC_OR_DEPARTMENT_OTHER): Payer: Self-pay

## 2022-05-21 DIAGNOSIS — M79641 Pain in right hand: Secondary | ICD-10-CM | POA: Insufficient documentation

## 2022-05-21 DIAGNOSIS — M79661 Pain in right lower leg: Secondary | ICD-10-CM

## 2022-05-21 DIAGNOSIS — R739 Hyperglycemia, unspecified: Secondary | ICD-10-CM

## 2022-05-21 DIAGNOSIS — Z794 Long term (current) use of insulin: Secondary | ICD-10-CM | POA: Insufficient documentation

## 2022-05-21 DIAGNOSIS — E1065 Type 1 diabetes mellitus with hyperglycemia: Secondary | ICD-10-CM | POA: Insufficient documentation

## 2022-05-21 DIAGNOSIS — M85641 Other cyst of bone, right hand: Secondary | ICD-10-CM | POA: Insufficient documentation

## 2022-05-21 LAB — COMPREHENSIVE METABOLIC PANEL
ALT: 22 U/L (ref 0–44)
AST: 21 U/L (ref 15–41)
Albumin: 4.2 g/dL (ref 3.5–5.0)
Alkaline Phosphatase: 85 U/L (ref 38–126)
Anion gap: 7 (ref 5–15)
BUN: 15 mg/dL (ref 6–20)
CO2: 26 mmol/L (ref 22–32)
Calcium: 8.8 mg/dL — ABNORMAL LOW (ref 8.9–10.3)
Chloride: 99 mmol/L (ref 98–111)
Creatinine, Ser: 0.73 mg/dL (ref 0.61–1.24)
GFR, Estimated: >60 mL/min (ref >60)
Glucose, Bld: 340 mg/dL — ABNORMAL HIGH (ref 70–99)
Potassium: 4.6 mmol/L (ref 3.5–5.1)
Sodium: 132 mmol/L — ABNORMAL LOW (ref 135–145)
Total Bilirubin: 0.8 mg/dL (ref 0.3–1.2)
Total Protein: 7.7 g/dL (ref 6.5–8.1)

## 2022-05-21 LAB — CBC WITH DIFFERENTIAL/PLATELET
Abs Immature Granulocytes: 0.01 10*3/uL (ref 0.00–0.07)
Basophils Absolute: 0 10*3/uL (ref 0.0–0.1)
Basophils Relative: 1 %
Eosinophils Absolute: 0.1 10*3/uL (ref 0.0–0.5)
Eosinophils Relative: 2 %
HCT: 43.1 % (ref 39.0–52.0)
Hemoglobin: 15.2 g/dL (ref 13.0–17.0)
Immature Granulocytes: 0 %
Lymphocytes Relative: 41 %
Lymphs Abs: 2 10*3/uL (ref 0.7–4.0)
MCH: 28.8 pg (ref 26.0–34.0)
MCHC: 35.3 g/dL (ref 30.0–36.0)
MCV: 81.8 fL (ref 80.0–100.0)
Monocytes Absolute: 0.3 10*3/uL (ref 0.1–1.0)
Monocytes Relative: 6 %
Neutro Abs: 2.4 10*3/uL (ref 1.7–7.7)
Neutrophils Relative %: 50 %
Platelets: 171 10*3/uL (ref 150–400)
RBC: 5.27 MIL/uL (ref 4.22–5.81)
RDW: 12.5 % (ref 11.5–15.5)
WBC: 4.8 10*3/uL (ref 4.0–10.5)
nRBC: 0 % (ref 0.0–0.2)

## 2022-05-21 LAB — CBG MONITORING, ED: Glucose-Capillary: 320 mg/dL — ABNORMAL HIGH (ref 70–99)

## 2022-05-21 MED ORDER — NAPROXEN 250 MG PO TABS
500.0000 mg | ORAL_TABLET | Freq: Once | ORAL | Status: AC
Start: 2022-05-21 — End: 2022-05-21
  Administered 2022-05-21: 500 mg via ORAL
  Filled 2022-05-21: qty 2

## 2022-05-21 MED ORDER — ACETAMINOPHEN 325 MG PO TABS
650.0000 mg | ORAL_TABLET | Freq: Once | ORAL | Status: AC
  Administered 2022-05-21: 650 mg via ORAL
  Filled 2022-05-21: qty 2

## 2022-05-21 MED ORDER — NAPROXEN 500 MG PO TABS: 500.0000 mg | ORAL_TABLET | Freq: Two times a day (BID) | ORAL | 0 refills | Status: AC

## 2022-05-21 MED ORDER — ACETAMINOPHEN 500 MG PO TABS: 500.0000 mg | ORAL_TABLET | Freq: Four times a day (QID) | ORAL | 0 refills | Status: AC | PRN

## 2022-05-21 NOTE — ED Triage Notes (Signed)
Pt reports to ED with multiple complaints. States that he feels like he is falling apart since he turned 46. States that the right wrist and right leg are hurting. Also states that he took his blood sugar on 2 different meters and they were too different readings. One was high and the other said 300. Wants clarification on what his blood sugar is.

## 2022-05-21 NOTE — ED Provider Notes (Signed)
[place] [facility] [place] Provider Note   CSN: [id number] Arrival date & time: 05/21/22  0707     History  Chief Complaint  Patient presents with   Hyperglycemia    Stanley James is a 32 y.o. male.  HPI     32 year old male comes in with chief complaint of elevated blood sugar, shin pain and right hand pain.  He states that yesterday he started noticing that his blood sugar was slightly high.  Normally the blood sugar is in the 150 range.  Yesterday he started noticing that the blood sugar was in 300 range.  He is also complaining of pain over his right wrist.  He has noted a nodule, that is painful and it has been present now for several weeks.  He is also complaining of 3 months pain of right shin that is constant.  The pain sometimes is excruciating.  It is worse with walking.  He denies any recent trauma and does not run long distances.  Patient denies any calf pain or tenderness, pain is exclusively located over the shin bone only.  Pain is worse with palpation.  Pain over the wrist is worse with any kind of wrist movement.  Home Medications Prior to Admission medications   Medication Sig Start Date End Date Taking? Authorizing Provider  acetaminophen (TYLENOL) 500 MG tablet Take 1 tablet (500 mg total) by mouth every 6 (six) hours as needed. 05/21/22  Yes Varney Biles, MD  naproxen (NAPROSYN) 500 MG tablet Take 1 tablet (500 mg total) by mouth 2 (two) times daily with a meal. 05/21/22  Yes Livy Ross, MD  benzonatate (TESSALON) 100 MG capsule Take 1 capsule (100 mg total) by mouth 3 (three) times daily as needed for cough. 05/19/21   Lucrezia Starch, MD  celecoxib (CELEBREX) 200 MG capsule Take 1 capsule (200 mg total) by mouth 2 (two) times daily. 11/27/21   Harris, Vernie Shanks, PA-C  ciprofloxacin-dexamethasone (CIPRODEX) OTIC suspension Place 4 drops into the right ear 2 (two) times daily. 11/27/21   Margarita Mail, PA-C  fenofibrate  (TRICOR) 145 MG tablet Take by mouth. 08/03/17   [provider]  ibuprofen (ADVIL) 800 MG tablet Take 1 tablet (800 mg total) by mouth every 8 (eight) hours as needed. 07/02/21   Long, Wonda Olds, MD  insulin isophane & regular human KwikPen (HUMULIN 70/30 MIX) (70-30) 100 UNIT/ML KwikPen INJECT 35 UNITS INTO THE SKIN 2 (TWO) TIMES DAILY. 06/12/20 06/12/21  Little, Wenda Overland, MD  insulin isophane & regular human (NOVOLIN 70/30 FLEXPEN RELION) (70-30) 100 UNIT/ML KwikPen Inject 35 Units into the skin 2 (two) times daily. 06/12/20 07/12/20  Little, Wenda Overland, MD  Insulin Pen Needle 32G X 4 MM MISC 60 each by Does not apply route in the morning and at bedtime. 06/12/20   Little, Wenda Overland, MD  lisinopril (ZESTRIL) 20 MG tablet Take by mouth. 08/03/17   [provider]  meclizine (ANTIVERT) 25 MG tablet Take 1 tablet (25 mg total) by mouth 3 (three) times daily as needed for dizziness. 11/27/21   Margarita Mail, PA-C  methylPREDNISolone (MEDROL DOSEPAK) 4 MG TBPK tablet Use as directed 11/27/21   Margarita Mail, PA-C  oxyCODONE-acetaminophen (PERCOCET/ROXICET) 5-325 MG tablet Take 1 tablet by mouth every 6 (six) hours as needed for severe pain. 07/02/21   Long, Wonda Olds, MD  QUEtiapine (SEROQUEL) 200 MG tablet Take by mouth. 08/16/17   [provider]  senna-docusate (SENOKOT-S) 8.6-50 MG tablet Take  1 tablet by mouth at bedtime as needed for moderate constipation or mild constipation. 07/02/21   Long, Wonda Olds, MD  dicyclomine (BENTYL) 20 MG tablet Take 1 tablet (20 mg total) by mouth 3 (three) times daily as needed for spasms (abdominal cramping). 04/25/19 06/12/20  Long, Wonda Olds, MD  insulin lispro (HUMALOG) 100 UNIT/ML injection Inject 0.12 mLs (12 Units total) into the skin 3 (three) times daily before meals. 07/03/19 06/12/20  Deno Etienne, DO      Allergies    Augmentin [amoxicillin-pot clavulanate]    Review of Systems   Review of Systems  All other systems reviewed and  are negative.   Physical Exam Updated Vital Signs BP (!) 145/89   Pulse 85   Temp 98.9 F (37.2 C) (Oral)   Resp 18   Ht '5\' 8"'$  (1.727 m)   Wt 73.9 kg   SpO2 100%   BMI 24.78 kg/m  Physical Exam Vitals and nursing note reviewed.  Constitutional:      Appearance: He is well-developed.  HENT:     Head: Atraumatic.  Cardiovascular:     Rate and Rhythm: Normal rate.  Pulmonary:     Effort: Pulmonary effort is normal.  Musculoskeletal:        General: Tenderness present. No swelling.     Cervical back: Neck supple.     Comments: Patient has no unilateral calf swelling or tenderness to palpation.  He has discomfort with direct palpation of the shin bone diffusely.  There is no erythema over the tibial region.  Soft nodule over the right wrist distally.  Tenderness with palpation.  No erythema or fluctuance noted.   Skin:    General: Skin is warm.  Neurological:     Mental Status: He is alert and oriented to person, place, and time.     ED Results / Procedures / Treatments   Labs (all labs ordered are listed, but only abnormal results are displayed) Labs Reviewed  COMPREHENSIVE METABOLIC PANEL - Abnormal; Notable for the following components:      Result Value   Sodium 132 (*)    Glucose, Bld 340 (*)    Calcium 8.8 (*)    All other components within normal limits  CBG MONITORING, ED - Abnormal; Notable for the following components:   Glucose-Capillary 320 (*)    All other components within normal limits  CBC WITH DIFFERENTIAL/PLATELET    EKG None  Radiology DG Tibia/Fibula Right  Result Date: 05/21/2022 CLINICAL DATA:  Pain EXAM: RIGHT TIBIA AND FIBULA - 2 VIEW COMPARISON:  None Available. FINDINGS: There is no evidence of fracture or other focal bone lesions. Soft tissues are unremarkable. IMPRESSION: Negative. Electronically Signed   By: Sammie Bench M.D.   On: 05/21/2022 09:12    Procedures Procedures    Medications Ordered in ED Medications  naproxen  (NAPROSYN) tablet 500 mg (500 mg Oral Given 05/21/22 0908)  acetaminophen (TYLENOL) tablet 650 mg (650 mg Oral Given 05/21/22 0908)    ED Course/ Medical Decision Making/ A&P                             Medical Decision Making Amount and/or Complexity of Data Reviewed Labs: ordered. Radiology: ordered.  Risk OTC drugs. Prescription drug management.  32 year old male comes in with chief complaint of elevated blood sugar, shin pain and wrist pain.  He has history of insulin-dependent diabetes/type 1 diabetes.  He denies any nausea,  vomiting, fevers, chills.  Patient has elevated blood sugar, but there is concerning history for underlying infectious process.  He has been taking his medications as prescribed.  Labs ordered to rule out DKA.  Differential includes DKA, hyperglycemia without ketosis.  Patient has nodule over the wrist.  That area does not appear infected at this time.  We will advise RICE and have him follow-up with hand surgery.  Differential diagnosis for that includes sebaceous cyst, ganglion cyst.  Finally patient is having months of shin pain.  No calf tenderness, no calf swelling.  No risk factors for DVT.  Differential diagnosis considered for that includes DVT, cellulitis, shinsplints.  X-ray of the shin/tibia was ordered and it does not show any evidence of pathologic fractures.  We will advise continued PCP follow-up.  Naprosyn and Tylenol prescribed.  Final Clinical Impression(s) / ED Diagnoses Final diagnoses:  Cyst of bone of right hand  Hyperglycemia without ketosis  Pain in right shin    Rx / DC Orders ED Discharge Orders          Ordered    naproxen (NAPROSYN) 500 MG tablet  2 times daily with meals        05/21/22 0933    acetaminophen (TYLENOL) 500 MG tablet  Every 6 hours PRN        05/21/22 0933              Varney Biles, MD 05/21/22 [phone]

## 2022-05-21 NOTE — Discharge Instructions (Addendum)
You are seen in the emergency room for elevated blood sugar.  There is no evidence of DKA.  We recommend that you keep a journal of your blood sugars and discuss that with your PCP to see if any adjustment is needed.  Return to the ER if your blood sugar gets over 500.  The right hand appears to have a ganglion cyst.  We recommend that you ice that area 3 or 4 times a day for 10 to 15 minutes and follow-up with the hand surgeon.  Call the hand surgeon at the number provided.  The shin x-rays negative.  It is unclear why you are having shin pain, have discussion with your primary care doctor about the discomfort.  Take the medications that are prescribed.

## 2022-05-21 NOTE — ED Notes (Signed)
Patient transported to X-ray 

## 2022-07-30 IMAGING — DX DG FOOT COMPLETE 3+V*L*
3 series · 3 of 3 positions shown · non-contrast
Comparison: Left foot radiographs 04/09/2016 left ankle radiographs
02/12/2017

CLINICAL DATA: Felt a snap and pain when walking this morning, pain
at top of foot and radiating to lateral aspect of the ankle on the
left

EXAM:
LEFT FOOT - COMPLETE 3+ VIEW; LEFT ANKLE COMPLETE - 3+ VIEW

[foot ap]
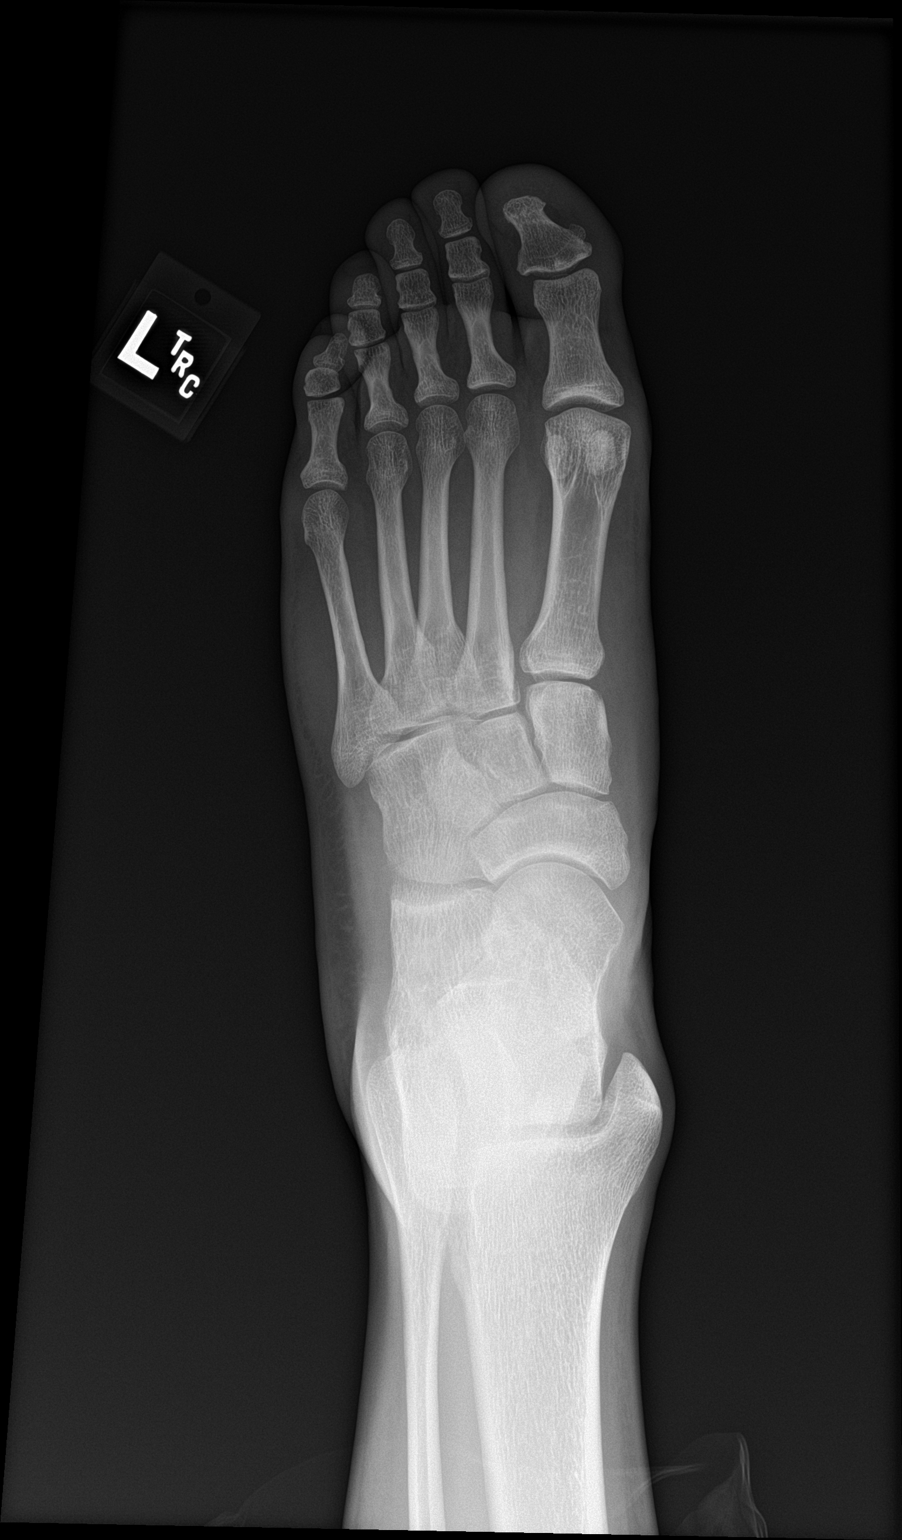

[foot obl]
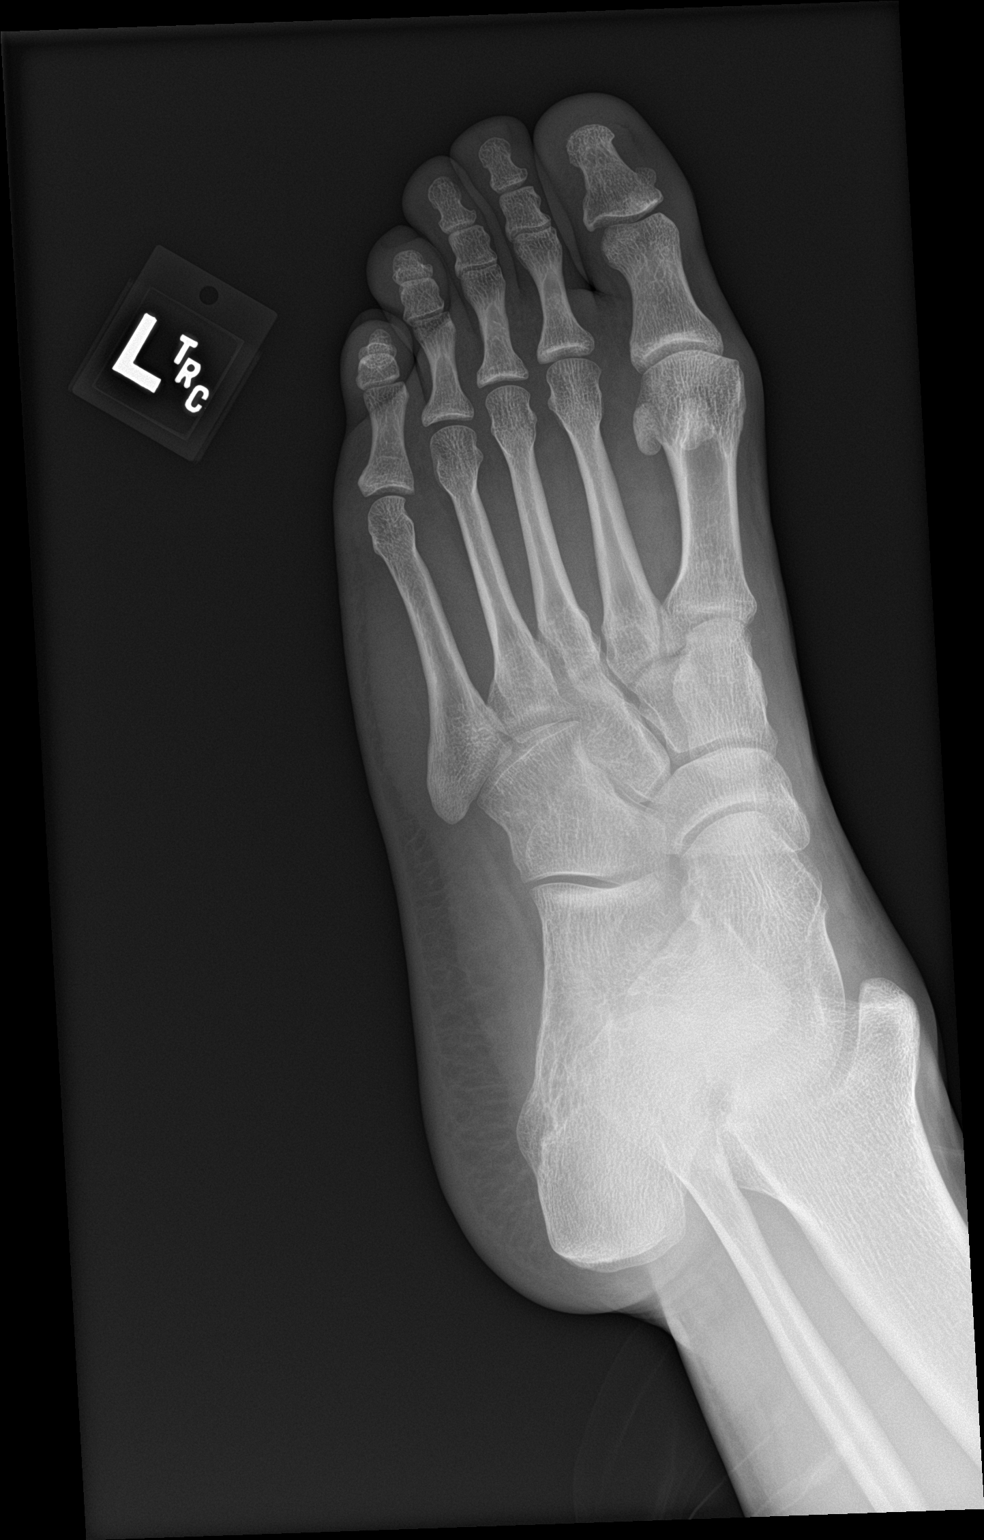

[foot lat]
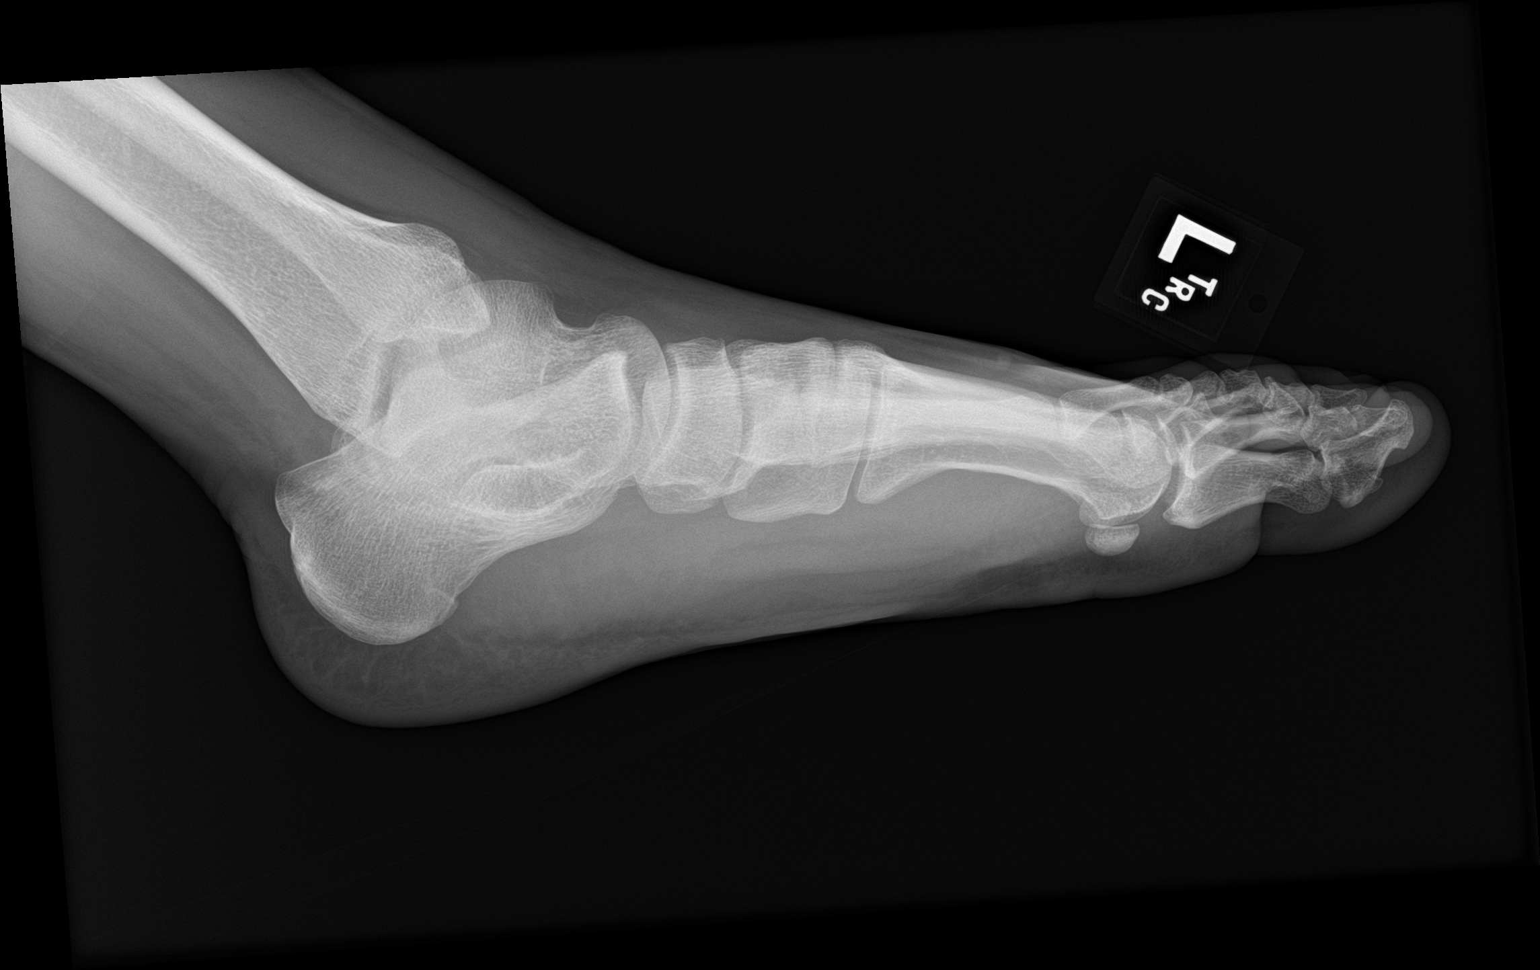

[3 of 3 positions shown; findings below may reference images not displayed]

FINDINGS: Ankle: There is no acute fracture or dislocation. Ankle alignment is
normal. The ankle mortise is intact. The soft tissues are
unremarkable.

Foot: There is no acute fracture or dislocation. Alignment is
normal. The Lisfranc and Chopart joints are intact. The soft tissues
are unremarkable.
IMPRESSION: No acute finding in the foot or ankle.

## 2022-07-30 IMAGING — DX DG ANKLE COMPLETE 3+V*L*
3 series · 3 of 3 positions shown · non-contrast
Comparison: Left foot radiographs 04/09/2016 left ankle radiographs
02/12/2017

CLINICAL DATA: Felt a snap and pain when walking this morning, pain
at top of foot and radiating to lateral aspect of the ankle on the
left

EXAM:
LEFT FOOT - COMPLETE 3+ VIEW; LEFT ANKLE COMPLETE - 3+ VIEW

[ankle ap]
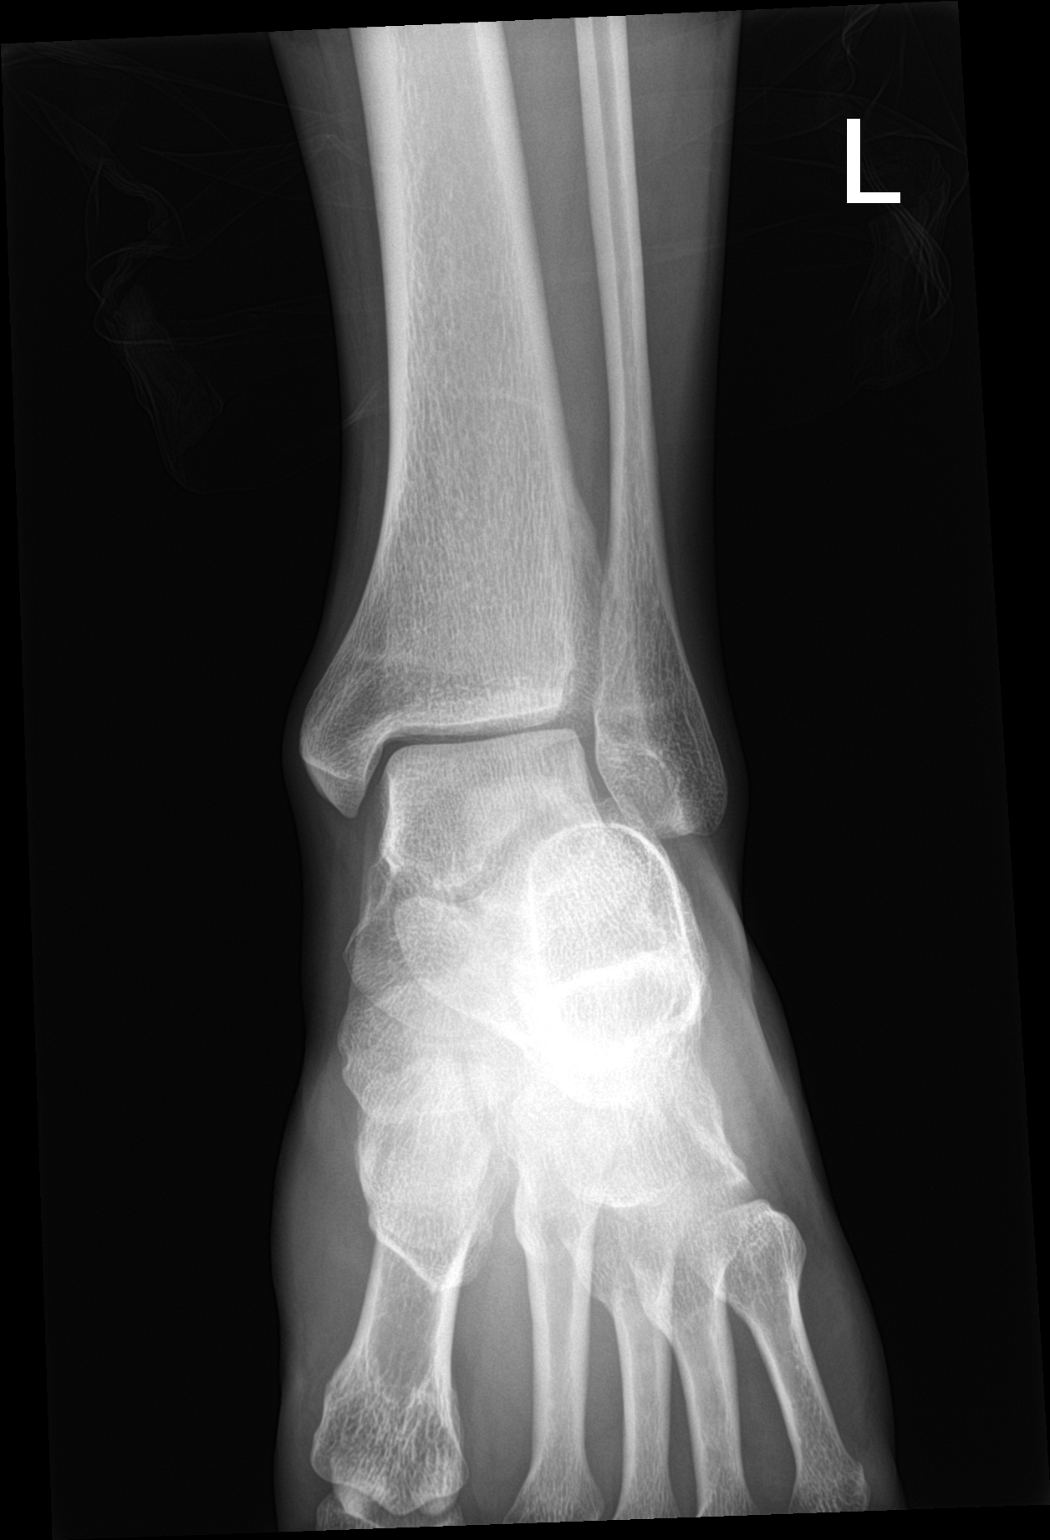

[ankle obl]
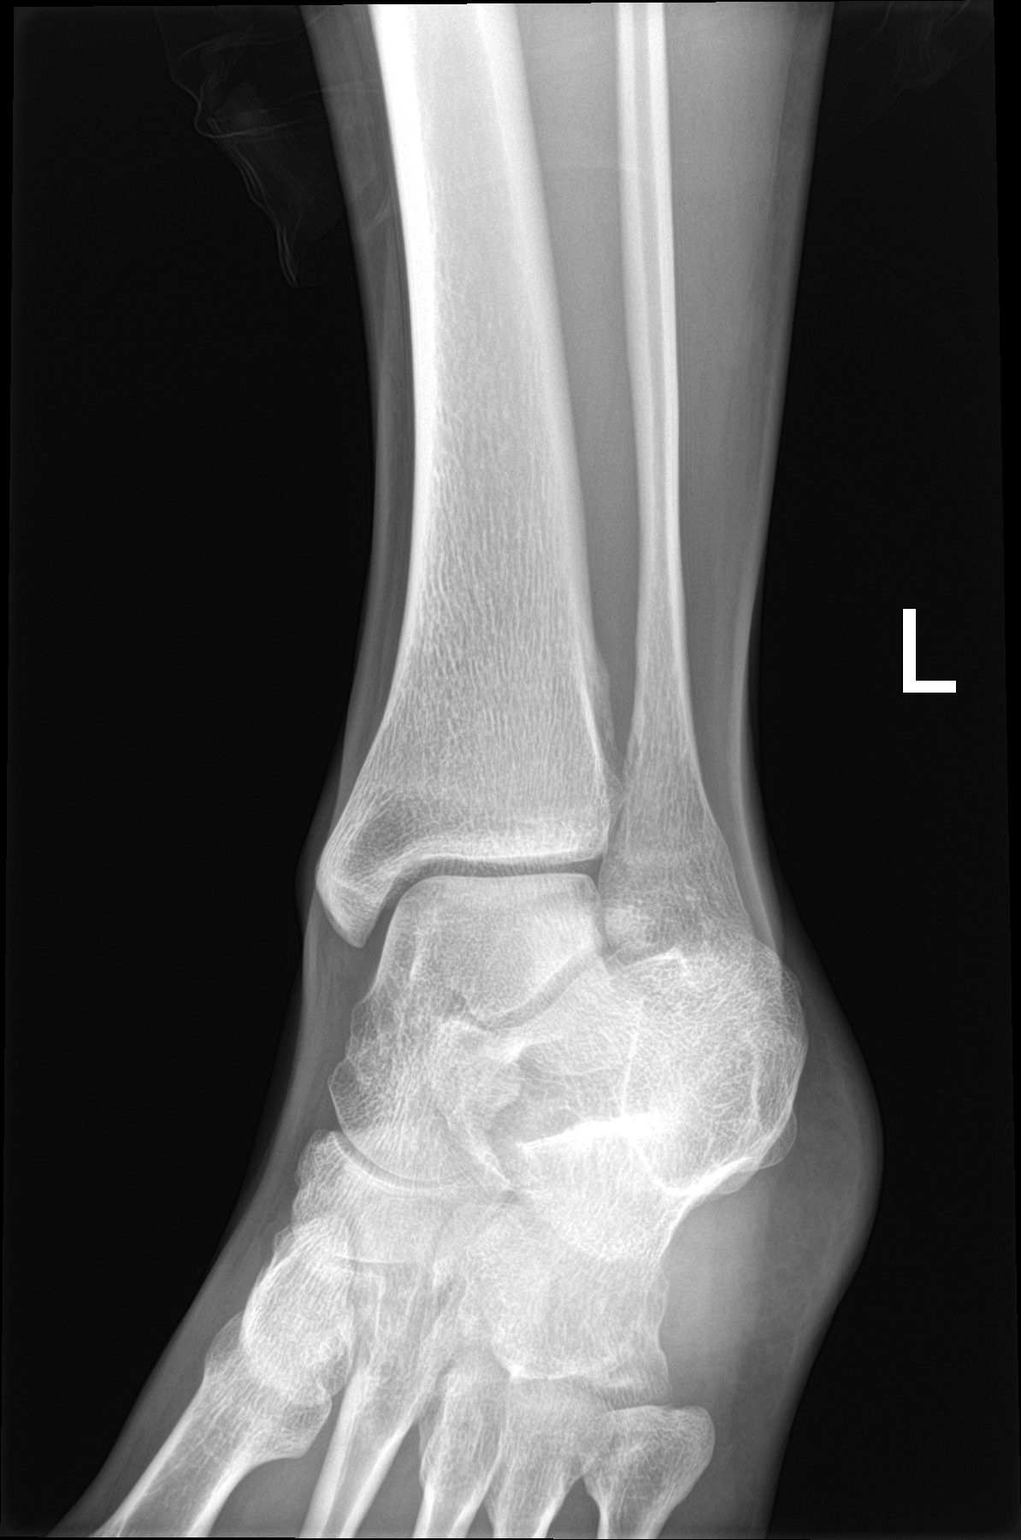

[ankle lat]
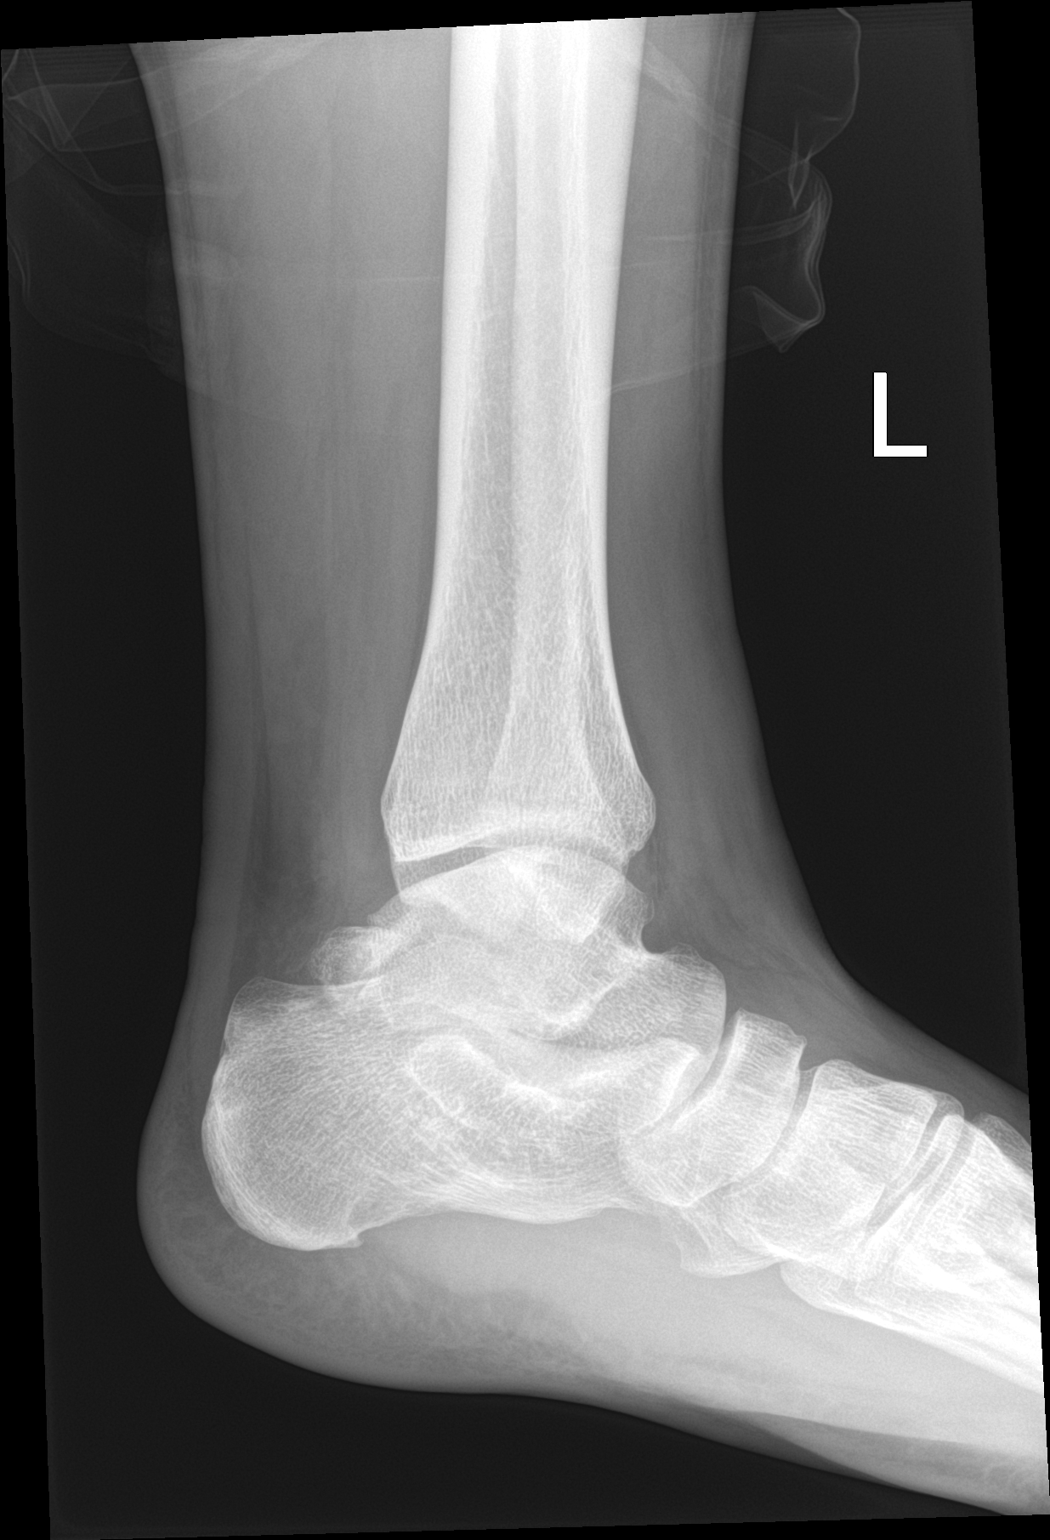

[3 of 3 positions shown; findings below may reference images not displayed]

FINDINGS: Ankle: There is no acute fracture or dislocation. Ankle alignment is
normal. The ankle mortise is intact. The soft tissues are
unremarkable.

Foot: There is no acute fracture or dislocation. Alignment is
normal. The Lisfranc and Chopart joints are intact. The soft tissues
are unremarkable.
IMPRESSION: No acute finding in the foot or ankle.

## 2023-04-20 ENCOUNTER — Other Ambulatory Visit: Payer: Self-pay

## 2023-04-20 ENCOUNTER — Emergency Department (HOSPITAL_BASED_OUTPATIENT_CLINIC_OR_DEPARTMENT_OTHER): Payer: Self-pay

## 2023-04-20 ENCOUNTER — Emergency Department (HOSPITAL_BASED_OUTPATIENT_CLINIC_OR_DEPARTMENT_OTHER)
Admission: EM | Admit: 2023-04-20 | Discharge: 2023-04-20 | Disposition: A | Discharge status: Home / Self Care | Payer: Self-pay | Attending: Emergency Medicine | Admitting: Emergency Medicine

## 2023-04-20 ENCOUNTER — Encounter (HOSPITAL_BASED_OUTPATIENT_CLINIC_OR_DEPARTMENT_OTHER): Payer: Self-pay | Admitting: Emergency Medicine

## 2023-04-20 DIAGNOSIS — S90112A Contusion of left great toe without damage to nail, initial encounter: Secondary | ICD-10-CM | POA: Insufficient documentation

## 2023-04-20 DIAGNOSIS — W208XXA Other cause of strike by thrown, projected or falling object, initial encounter: Secondary | ICD-10-CM | POA: Insufficient documentation

## 2023-04-20 DIAGNOSIS — S99922A Unspecified injury of left foot, initial encounter: Secondary | ICD-10-CM

## 2023-04-20 MED ORDER — FENTANYL CITRATE PF 50 MCG/ML IJ SOSY: 50.0000 ug | PREFILLED_SYRINGE | Freq: Once | INTRAMUSCULAR | Status: DC

## 2023-04-20 MED ORDER — DOXYCYCLINE HYCLATE 100 MG PO CAPS: 100.0000 mg | ORAL_CAPSULE | Freq: Two times a day (BID) | ORAL | 0 refills | Status: AC

## 2023-04-20 MED ORDER — ONDANSETRON HCL 4 MG/2ML IJ SOLN: 4.0000 mg | Freq: Once | INTRAMUSCULAR | Status: DC

## 2023-04-20 NOTE — ED Provider Triage Note (Signed)
 Emergency Medicine Provider Triage Evaluation Note  Stanley James , a 33 y.o. male  was evaluated in triage.  Pt complains of toe pain.  Review of Systems  Positive:  Negative:   Physical Exam  BP 135/74 (BP Location: Right Arm)   Pulse (!) 114   Temp 97.8 F (36.6 C)   Resp 18   Ht 5' 8 (1.727 m)   Wt 71.7 kg   SpO2 97%   BMI 24.02 kg/m  Gen:   Awake, no distress   Resp:  Normal effort  MSK:   Moves extremities without difficulty  Other:    Medical Decision Making  Medically screening exam initiated at 2:55 PM.  Appropriate orders placed.  Stanley James was informed that the remainder of the evaluation will be completed by another provider, this initial triage assessment does not replace that evaluation, and the importance of remaining in the ED until their evaluation is complete.  Dropped large object on toe 5 days ago. Pani in left great toe. Small bruise on top of toe. No infectious symptoms.   Hoy Nidia FALCON, [place] 04/20/23 [phone]

## 2023-04-20 NOTE — Discharge Instructions (Addendum)
 It was a pleasure taking part in your care.  As discussed, I am starting you on antibiotics.  Please take these twice a day for the next 5 days.  Please be aware that they do increase your risk of sunburn.  Please keep toes buddy tape for comfort.  Follow-up with podiatry.  Please call and make an appointment to be seen.

## 2023-04-20 NOTE — ED Triage Notes (Signed)
Big left had something fall on it last Thursday it bled also wants a spot on the back of his rt leg checked

## 2023-04-20 NOTE — ED Provider Notes (Incomplete)
[place] [facility] AT [facility] [place] Provider Note   CSN: [id number] Arrival date & time: 04/20/23  1407     History  Chief Complaint  Patient presents with  . Toe Injury    Stanley James is a 33 y.o. male with medical history of diabetes.  Patient presents to ED for evaluation of injury to left great toe.  States that on Thursday of this past week he dropped a shelf onto his left great toe.  Reports he had immediate pain.  States that over the course of the last few days, toe has been swelling.  States that last night his wife was able to express yellow material from it, he believes it was pus.  States it has been bleeding some since.  Denies fevers, nausea or vomiting at home.  Reports he is able to ambulate on the foot however does limp.  Denies medications prior to arrival.  HPI     Home Medications Prior to Admission medications   Medication Sig Start Date End Date Taking? Authorizing Provider  acetaminophen (TYLENOL) 500 MG tablet Take 1 tablet (500 mg total) by mouth every 6 (six) hours as needed. 05/21/22   Derwood Kaplan, MD  benzonatate (TESSALON) 100 MG capsule Take 1 capsule (100 mg total) by mouth 3 (three) times daily as needed for cough. 05/19/21   Milagros Loll, MD  celecoxib (CELEBREX) 200 MG capsule Take 1 capsule (200 mg total) by mouth 2 (two) times daily. 11/27/21   Harris, Cammy Copa, PA-C  ciprofloxacin-dexamethasone (CIPRODEX) OTIC suspension Place 4 drops into the right ear 2 (two) times daily. 11/27/21   Arthor Captain, PA-C  fenofibrate (TRICOR) 145 MG tablet Take by mouth. 08/03/17   [provider]  ibuprofen (ADVIL) 800 MG tablet Take 1 tablet (800 mg total) by mouth every 8 (eight) hours as needed. 07/02/21   Long, Arlyss Repress, MD  insulin isophane & regular human KwikPen (HUMULIN 70/30 MIX) (70-30) 100 UNIT/ML KwikPen INJECT 35 UNITS INTO THE SKIN 2 (TWO) TIMES DAILY. 06/12/20 06/12/21  Little, Ambrose Finland, MD  insulin isophane  & regular human (NOVOLIN 70/30 FLEXPEN RELION) (70-30) 100 UNIT/ML KwikPen Inject 35 Units into the skin 2 (two) times daily. 06/12/20 07/12/20  Little, Ambrose Finland, MD  Insulin Pen Needle 32G X 4 MM MISC 60 each by Does not apply route in the morning and at bedtime. 06/12/20   Little, Ambrose Finland, MD  lisinopril (ZESTRIL) 20 MG tablet Take by mouth. 08/03/17   [provider]  meclizine (ANTIVERT) 25 MG tablet Take 1 tablet (25 mg total) by mouth 3 (three) times daily as needed for dizziness. 11/27/21   Arthor Captain, PA-C  methylPREDNISolone (MEDROL DOSEPAK) 4 MG TBPK tablet Use as directed 11/27/21   Arthor Captain, PA-C  naproxen (NAPROSYN) 500 MG tablet Take 1 tablet (500 mg total) by mouth 2 (two) times daily with a meal. 05/21/22   Derwood Kaplan, MD  oxyCODONE-acetaminophen (PERCOCET/ROXICET) 5-325 MG tablet Take 1 tablet by mouth every 6 (six) hours as needed for severe pain. 07/02/21   Long, Arlyss Repress, MD  QUEtiapine (SEROQUEL) 200 MG tablet Take by mouth. 08/16/17   [provider]  senna-docusate (SENOKOT-S) 8.6-50 MG tablet Take 1 tablet by mouth at bedtime as needed for moderate constipation or mild constipation. 07/02/21   Long, Arlyss Repress, MD  dicyclomine (BENTYL) 20 MG tablet Take 1 tablet (20 mg total) by mouth 3 (three) times daily as needed for spasms (abdominal cramping). 04/25/19 06/12/20  Long, Arlyss Repress, MD  insulin lispro (HUMALOG) 100 UNIT/ML injection Inject 0.12 mLs (12 Units total) into the skin 3 (three) times daily before meals. 07/03/19 06/12/20  Melene Plan, DO      Allergies    Augmentin [amoxicillin-pot clavulanate]    Review of Systems   Review of Systems  Musculoskeletal:  Positive for myalgias.  All other systems reviewed and are negative.   Physical Exam Updated Vital Signs BP 135/74 (BP Location: Right Arm)   Pulse (!) 114   Temp 97.8 F (36.6 C)   Resp 18   Ht 5\' 8"  (1.727 m)   Wt 71.7 kg   SpO2 97%   BMI 24.02 kg/m  Physical Exam Vitals  and nursing note reviewed.  Constitutional:      General: He is not in acute distress.    Appearance: He is well-developed.  HENT:     Head: Normocephalic and atraumatic.  Eyes:     Conjunctiva/sclera: Conjunctivae normal.  Cardiovascular:     Rate and Rhythm: Normal rate and regular rhythm.     Heart sounds: No murmur heard. Pulmonary:     Effort: Pulmonary effort is normal. No respiratory distress.     Breath sounds: Normal breath sounds.  Abdominal:     Palpations: Abdomen is soft.     Tenderness: There is no abdominal tenderness.  Musculoskeletal:        General: No swelling.     Cervical back: Neck supple.     Comments: Bruising to dorsal aspect of left great toe.  No obvious deformity.  2+ DP pulse in the left foot.  Full range of motion of ankle.  Patient does have scant dried blood to the lateral portion of his left toe however no obvious abscess needing drainage.  No signs of paronychia.  Skin:    General: Skin is warm and dry.     Capillary Refill: Capillary refill takes less than 2 seconds.  Neurological:     Mental Status: He is alert.  Psychiatric:        Mood and Affect: Mood normal.    ED Results / Procedures / Treatments   Labs (all labs ordered are listed, but only abnormal results are displayed) Labs Reviewed - No data to display  EKG None  Radiology DG Toe Great Left Result Date: 04/20/2023 CLINICAL DATA:  Pain post blunt trauma EXAM: LEFT GREAT TOE COMPARISON:  09/05/2021 FINDINGS: There is no evidence of fracture or dislocation. There is no evidence of arthropathy or other focal bone abnormality. Soft tissues are unremarkable. IMPRESSION: Negative. Electronically Signed   By: Corlis Leak M.D.   On: 04/20/2023 15:22    Procedures Procedures    Medications Ordered in ED Medications - No data to display  ED Course/ Medical Decision Making/ A&P  Medical Decision Making  33 year old male presents for evaluation of toe injury to left great toe.   Please see HPI for further details.  On my exam, the patient left great toe has no deformity.  There is scant dried blood to the lateral portion of his nailbed.  No paronychia in need of drainage.  He has a 2+ DP pulse in his left foot.  He has full range of motion of his ankle.    Patient reports pus draining from toe since last night.  Possibly due to paronychia.  Seems at this time the paronychia has drained.  Will start patient on doxycycline for 5 days twice daily.  Will instruct patient  on how to buddy tape toe.  X-ray imaging collected in triage unremarkable for any kind of acute fracture.  Patient will be referred to podiatry as he reports he has a history of ingrown toenails and is concerned that his toe might become ingrown.  Also written work note.   Final Clinical Impression(s) / ED Diagnoses Final diagnoses:  None    Rx / DC Orders ED Discharge Orders     None

## 2023-05-27 NOTE — ED Provider Notes (Signed)
 [place] [facility] [place] Provider Note   CSN: [id number] Arrival date & time: 04/20/23  1407     History  Chief Complaint  Patient presents with   Toe Injury    Stanley James is a 33 y.o. male who complains of toe pain in left great toe. States he dropped large object on toe 5 days ago. States his girlfriend squeezed some pus out of it last night. No fevers, nausea or vomiting.   HPI     Home Medications Prior to Admission medications   Medication Sig Start Date End Date Taking? Authorizing Provider  doxycycline  (VIBRAMYCIN ) 100 MG capsule Take 1 capsule (100 mg total) by mouth 2 (two) times daily. 04/20/23  Yes Ruthell Lonni FALCON, PA-C  acetaminophen  (TYLENOL ) 500 MG tablet Take 1 tablet (500 mg total) by mouth every 6 (six) hours as needed. 05/21/22   Charlyn Sora, MD  benzonatate  (TESSALON ) 100 MG capsule Take 1 capsule (100 mg total) by mouth 3 (three) times daily as needed for cough. 05/19/21   Schuyler Charlie RAMAN, MD  celecoxib  (CELEBREX ) 200 MG capsule Take 1 capsule (200 mg total) by mouth 2 (two) times daily. 11/27/21   Harris, Abigail, PA-C  ciprofloxacin -dexamethasone  (CIPRODEX ) OTIC suspension Place 4 drops into the right ear 2 (two) times daily. 11/27/21   Harris, Abigail, PA-C  fenofibrate (TRICOR) 145 MG tablet Take by mouth. 08/03/17   [provider]  ibuprofen  (ADVIL ) 800 MG tablet Take 1 tablet (800 mg total) by mouth every 8 (eight) hours as needed. 07/02/21   Long, Joshua G, MD  insulin  isophane & regular human KwikPen (HUMULIN  70/30 MIX) (70-30) 100 UNIT/ML KwikPen INJECT 35 UNITS INTO THE SKIN 2 (TWO) TIMES DAILY. 06/12/20 06/12/21  Little, Vernell Search, MD  insulin  isophane & regular human (NOVOLIN  70/30 FLEXPEN RELION) (70-30) 100 UNIT/ML KwikPen Inject 35 Units into the skin 2 (two) times daily. 06/12/20 07/12/20  Little, Vernell Search, MD  Insulin  Pen Needle 32G X 4 MM MISC 60 each by Does not apply route in the  morning and at bedtime. 06/12/20   Little, Vernell Search, MD  lisinopril (ZESTRIL) 20 MG tablet Take by mouth. 08/03/17   [provider]  meclizine  (ANTIVERT ) 25 MG tablet Take 1 tablet (25 mg total) by mouth 3 (three) times daily as needed for dizziness. 11/27/21   Harris, Abigail, PA-C  methylPREDNISolone  (MEDROL  DOSEPAK) 4 MG TBPK tablet Use as directed 11/27/21   Harris, Abigail, PA-C  naproxen  (NAPROSYN ) 500 MG tablet Take 1 tablet (500 mg total) by mouth 2 (two) times daily with a meal. 05/21/22   Charlyn Sora, MD  oxyCODONE -acetaminophen  (PERCOCET/ROXICET) 5-325 MG tablet Take 1 tablet by mouth every 6 (six) hours as needed for severe pain. 07/02/21   Long, Joshua G, MD  QUEtiapine (SEROQUEL) 200 MG tablet Take by mouth. 08/16/17   [provider]  senna-docusate (SENOKOT-S) 8.6-50 MG tablet Take 1 tablet by mouth at bedtime as needed for moderate constipation or mild constipation. 07/02/21   Long, Fonda MATSU, MD  dicyclomine  (BENTYL ) 20 MG tablet Take 1 tablet (20 mg total) by mouth 3 (three) times daily as needed for spasms (abdominal cramping). 04/25/19 06/12/20  Long, Joshua G, MD  insulin  lispro (HUMALOG ) 100 UNIT/ML injection Inject 0.12 mLs (12 Units total) into the skin 3 (three) times daily before meals. 07/03/19 06/12/20  Emil Share, DO      Allergies    Augmentin [amoxicillin-pot clavulanate]    Review of Systems  Review of Systems  All other systems reviewed and are negative.   Physical Exam Updated Vital Signs BP 135/74 (BP Location: Right Arm)   Pulse (!) 114   Temp 97.8 F (36.6 C)   Resp 18   Ht 5' 8 (1.727 m)   Wt 71.7 kg   SpO2 97%   BMI 24.02 kg/m  Physical Exam Vitals and nursing note reviewed.  Constitutional:      General: He is not in acute distress.    Appearance: He is well-developed.  HENT:     Head: Normocephalic and atraumatic.  Eyes:     Conjunctiva/sclera: Conjunctivae normal.  Cardiovascular:     Rate and Rhythm: Normal rate and  regular rhythm.     Heart sounds: No murmur heard. Pulmonary:     Effort: Pulmonary effort is normal. No respiratory distress.     Breath sounds: Normal breath sounds.  Abdominal:     Palpations: Abdomen is soft.     Tenderness: There is no abdominal tenderness.  Musculoskeletal:     Cervical back: Neck supple.     Comments: Small amount of bruising to dorsal side of left great toe. Erythema near nailbed.  Skin:    General: Skin is warm and dry.     Capillary Refill: Capillary refill takes less than 2 seconds.  Neurological:     Mental Status: He is alert.  Psychiatric:        Mood and Affect: Mood normal.     ED Results / Procedures / Treatments   Labs (all labs ordered are listed, but only abnormal results are displayed) Labs Reviewed - No data to display  EKG None  Radiology No results found.  Procedures Procedures   Medications Ordered in ED Medications - No data to display  ED Course/ Medical Decision Making/ A&P  Medical Decision Making Risk Prescription drug management.   Here for left great toe injury. Xray negative. States he was able to express purulent drainage from area so will start on doxycyline. Will follow up outpatient with PCP.   Final Clinical Impression(s) / ED Diagnoses Final diagnoses:  Injury of toe on left foot, initial encounter    Rx / DC Orders ED Discharge Orders          Ordered    doxycycline  (VIBRAMYCIN ) 100 MG capsule  2 times daily        04/20/23 1552              Ruthell Lonni FALCON, PA-C 05/27/23 1329    Kingsley, Victoria K, DO 05/27/23 1438
